# Patient Record
Sex: Male | Born: 1955
Health system: Southern US, Community
[De-identification: ages and names within clinical notes are randomized; demographics above are authoritative.]

## PROBLEM LIST (undated history)

## (undated) DIAGNOSIS — I4891 Unspecified atrial fibrillation: Secondary | ICD-10-CM

## (undated) DIAGNOSIS — E7800 Pure hypercholesterolemia, unspecified: Secondary | ICD-10-CM

## (undated) DIAGNOSIS — R0789 Other chest pain: Secondary | ICD-10-CM

## (undated) DIAGNOSIS — K219 Gastro-esophageal reflux disease without esophagitis: Secondary | ICD-10-CM

## (undated) DIAGNOSIS — Z87442 Personal history of urinary calculi: Secondary | ICD-10-CM

## (undated) DIAGNOSIS — J45909 Unspecified asthma, uncomplicated: Secondary | ICD-10-CM

## (undated) DIAGNOSIS — Q8500 Neurofibromatosis, unspecified: Secondary | ICD-10-CM

## (undated) DIAGNOSIS — I10 Essential (primary) hypertension: Secondary | ICD-10-CM

## (undated) DIAGNOSIS — N200 Calculus of kidney: Secondary | ICD-10-CM

## (undated) DIAGNOSIS — J309 Allergic rhinitis, unspecified: Secondary | ICD-10-CM

## (undated) DIAGNOSIS — E7849 Other hyperlipidemia: Secondary | ICD-10-CM

## (undated) HISTORY — PX: PROSTATE SURGERY: SHX751

## (undated) HISTORY — PX: ABDOMINAL SURGERY: SHX537

---

## 2004-08-06 ENCOUNTER — Ambulatory Visit: Payer: Self-pay | Admitting: Family Medicine

## 2004-08-09 ENCOUNTER — Ambulatory Visit: Payer: Self-pay | Admitting: Family Medicine

## 2004-08-28 ENCOUNTER — Ambulatory Visit: Payer: Self-pay | Admitting: Internal Medicine

## 2004-08-30 ENCOUNTER — Ambulatory Visit: Payer: Self-pay | Admitting: Internal Medicine

## 2004-09-14 ENCOUNTER — Ambulatory Visit: Payer: Self-pay | Admitting: Internal Medicine

## 2004-09-25 ENCOUNTER — Inpatient Hospital Stay: Payer: Self-pay | Admitting: Surgery

## 2004-12-12 ENCOUNTER — Ambulatory Visit: Payer: Self-pay | Admitting: Urology

## 2010-12-10 ENCOUNTER — Observation Stay: Payer: Self-pay | Admitting: Internal Medicine

## 2011-01-01 ENCOUNTER — Observation Stay: Payer: Self-pay | Admitting: Specialist

## 2011-07-01 LAB — CBC
HCT: 45.2 % (ref 40.0–52.0)
HGB: 15.1 g/dL (ref 13.0–18.0)
MCV: 90 fL (ref 80–100)
Platelet: 293 10*3/uL (ref 150–440)

## 2011-07-01 LAB — BASIC METABOLIC PANEL
Anion Gap: 10 (ref 7–16)
BUN: 25 mg/dL — ABNORMAL HIGH (ref 7–18)
Calcium, Total: 9.4 mg/dL (ref 8.5–10.1)
Chloride: 106 mmol/L (ref 98–107)
Co2: 26 mmol/L (ref 21–32)
Creatinine: 1.3 mg/dL (ref 0.60–1.30)
EGFR (Non-African Amer.): >60
Osmolality: 287 (ref 275–301)
Sodium: 142 mmol/L (ref 136–145)

## 2011-07-02 ENCOUNTER — Observation Stay: Payer: Self-pay | Admitting: Internal Medicine

## 2011-07-02 LAB — TROPONIN I: Troponin-I: <0.02 ng/mL

## 2011-07-02 LAB — CK TOTAL AND CKMB (NOT AT ARMC)
CK, Total: 44 U/L (ref 35–232)
CK, Total: 44 U/L (ref 35–232)
CK-MB: <0.5 ng/mL — ABNORMAL LOW (ref 0.5–3.6)
CK-MB: <0.5 ng/mL — ABNORMAL LOW (ref 0.5–3.6)

## 2011-09-09 ENCOUNTER — Observation Stay: Payer: Self-pay | Admitting: Internal Medicine

## 2011-09-09 LAB — BASIC METABOLIC PANEL
Anion Gap: 8 (ref 7–16)
BUN: 16 mg/dL (ref 7–18)
Calcium, Total: 9.2 mg/dL (ref 8.5–10.1)
Chloride: 108 mmol/L — ABNORMAL HIGH (ref 98–107)
Co2: 24 mmol/L (ref 21–32)
Creatinine: 1.11 mg/dL (ref 0.60–1.30)
EGFR (African American): >60
Glucose: 89 mg/dL (ref 65–99)
Osmolality: 280 (ref 275–301)
Potassium: 4 mmol/L (ref 3.5–5.1)
Sodium: 140 mmol/L (ref 136–145)

## 2011-09-09 LAB — TROPONIN I
Troponin-I: <0.02 ng/mL
Troponin-I: <0.02 ng/mL

## 2011-09-09 LAB — HEPATIC FUNCTION PANEL A (ARMC)
Alkaline Phosphatase: 67 U/L (ref 50–136)
Bilirubin,Total: 0.5 mg/dL (ref 0.2–1.0)
SGPT (ALT): 13 U/L
Total Protein: 6.6 g/dL (ref 6.4–8.2)

## 2011-09-09 LAB — CBC
HCT: 43.6 % (ref 40.0–52.0)
HGB: 14.3 g/dL (ref 13.0–18.0)
MCHC: 32.9 g/dL (ref 32.0–36.0)
MCV: 90 fL (ref 80–100)
Platelet: 316 10*3/uL (ref 150–440)
RDW: 14.5 % (ref 11.5–14.5)

## 2011-09-09 LAB — CK TOTAL AND CKMB (NOT AT ARMC)
CK, Total: 36 U/L (ref 35–232)
CK-MB: <0.5 ng/mL — ABNORMAL LOW (ref 0.5–3.6)

## 2011-09-09 LAB — CK-MB: CK-MB: <0.5 ng/mL — ABNORMAL LOW (ref 0.5–3.6)

## 2012-11-30 ENCOUNTER — Emergency Department: Payer: Self-pay | Admitting: Emergency Medicine

## 2014-06-26 DIAGNOSIS — E784 Other hyperlipidemia: Secondary | ICD-10-CM | POA: Diagnosis not present

## 2014-07-03 DIAGNOSIS — F419 Anxiety disorder, unspecified: Secondary | ICD-10-CM | POA: Diagnosis not present

## 2014-07-03 DIAGNOSIS — E784 Other hyperlipidemia: Secondary | ICD-10-CM | POA: Diagnosis not present

## 2014-07-09 NOTE — H&P (Signed)
PATIENT NAME:  Hector Pacheco, JELINSKI MR#:  [id number] DATE OF BIRTH:  Apr 17, 1955  DATE OF ADMISSION:  07/02/2011  REFERRING PHYSICIAN: Dr. Marjean Donna PRIMARY CARE PHYSICIAN: Dr. Jacqualine Code  CHIEF COMPLAINT: Chest pain.  HISTORY OF PRESENT ILLNESS: This is a 59 year old male with history of benign prostatic hypertrophy and mild mental retardation presents with complains of chest pain. Patient reports chest pain started today while patient was at rest. Patient describes the pain as pressure like, midsternal, nonradiating, waxed and waned, worsened by exertion. Patient reports he had similar episodes of pain last year when he was admitted to the hospital with negative work-up. Reports at this point pain is gone. Patient denies any shortness of breath, nausea, palpitations, sweating or vomiting with this episode. Patient was given 324 of aspirin in ED, did not require any nitroglycerin, aspirin; was resolved prior to that. Patient was here in October of last year due to some complaints of chest pain. Patient had negative stress test done last year.   PAST MEDICAL HISTORY: 1. Benign prostatic hypertrophy.  2. Mild mental retardation.   MEDICATIONS:  1. Aspirin 81 mg oral daily.  2. Nitroglycerin 0.4 mg sublingual every five minutes as needed.  3. Prilosec 20 mg oral daily.  4. TUMS 500 mg oral 2 times a day.   ALLERGIES: Flomax.   SOCIAL HISTORY: No smoking. No drinking. No drug use.   FAMILY HISTORY: Significant for father has history of  cancer. Mother has heart disease.  REVIEW OF SYSTEMS: CONSTITUTIONAL: Patient denies any fever, chills, weakness. EYES: Denies any blurry vision, double vision or pain or redness. ENT: Denies any tinnitus, ear pain, hearing loss. RESPIRATORY: Denies any cough, wheezing, hemoptysis, dyspnea. CARDIOVASCULAR: Has complaints of chest pain. Denies any orthopnea, edema, palpitations. GASTROINTESTINAL: Denies any nausea, vomiting, diarrhea, abdominal pain. GENITOURINARY:  Denies any dysuria, renal colic, hematuria. ENDOCRINE: Denies any polyuria, polydipsia, heat or cold intolerance. HEMATOLOGY: Denies any easy bruising, blood clots, bleeding diathesis. MUSCULOSKELETAL: Denies any back pain, arthritis, cramps. NEUROLOGICAL: Denies any numbness, weakness, dysarthria, epilepsy, tremors. PSYCH: Denies any alcohol or drug abuse. No bipolar disorder or insomnia or anxiety.   PHYSICAL EXAMINATION:  VITAL SIGNS: Temperature 98.4, pulse 70, respiratory rate 16, blood pressure 130/79, pulse oximetry 96% on room air.   GENERAL: Well-nourished male, is comfortable in bed, no apparent distress.   HEENT: Head is normocephalic. Pupils equal, reactive to light. Pink conjunctiva. Anicteric sclera. Moist oral mucosa.   NECK: Supple. No thyromegaly. No JVD.   CHEST: Good air entry bilaterally. No wheezing, rales, rhonchi.   CARDIOVASCULAR: S1, S2 heard. No rubs, murmur, gallops.   ABDOMEN: Soft, nontender, nondistended. Bowel sounds present.   EXTREMITIES: No edema, no clubbing, no cyanosis.   NEUROLOGICAL: Cranial nerves grossly intact. Moves all extremities 5/5 without any focal deficit.   PSYCHIATRIC: Appropriate affect. Awake, alert x3. Intact judgment and insight.   LABORATORY, DIAGNOSTIC AND RADIOLOGICAL DATA: Glucose 90, BUN 25, creatinine 1.3, sodium 142, potassium 4.2, chloride 106, CO2 26, calcium 9.4, troponin less than 0.02. White blood cells 7.8, hemoglobin 15.1, hematocrit 45.2, platelets 293.   EKG shows no changes from previous.   ASSESSMENT AND PLAN:  1. Chest pain. Patient has recurrent episodes of chest pain. Had negative stress test done last year. No EKG changes. Pain resolved at this point. Will continue patient on aspirin 81 mg oral daily. Will cycle cardiac enzymes. Secondary to patient's multiple admissions because of this chest pain will consult his primary cardiologist, Dr. Saralyn Pilar.  2.  Will start patient on Protonix for GI prophylaxis and  subcutaneous heparin for deep vein thrombosis prophylaxis.   TOTAL TIME SPENT FOR PATIENT CARE: 45 minutes.   CODE STATUS: Patient is FULL CODE.   ____________________________ Albertine Patricia, MD dse:cms D: 07/02/2011 04:34:32 ET T: 07/02/2011 08:34:51 ET JOB#: [id number]  cc: Albertine Patricia, MD, <Dictator> Milinda Pointer. Jacqualine Code, MD Lenzie Sandler Graciela Husbands MD ELECTRONICALLY SIGNED 07/03/2011 22:03

## 2014-07-09 NOTE — Discharge Summary (Signed)
PATIENT NAME:  Hector Pacheco, Hector Pacheco MR#:  [id number] DATE OF BIRTH:  1955-04-12  DATE OF ADMISSION:  07/02/2011 DATE OF DISCHARGE:  07/02/2011  ADMITTING PHYSICIAN: Phillips Climes, MD.  DISCHARGING PHYSICIAN: Gladstone Lighter, MD.   PRIMARY CARE PHYSICIAN: Debbora Dus, MD.  PRIMARY CARDIOLOGIST: Isaias Cowman, MD.   [place]: None.   DISCHARGE DIAGNOSES:  1. Chest pain, likely gastritis.  2. Gastroesophageal reflux disease.   DISCHARGE HOME MEDICATIONS:  1. Aspirin 81 mg p.o. daily.  2. Nitroglycerin sublingual tablet 0.4 mg p.o. daily p.r.n. for chest pain.  3. Tums 500 mg 2 tablets daily.  4. Klonopin 0.5 mg p.o. b.i.d. p.r.n.  5. Nexium 40 mg p.o. b.i.d. for three weeks and then daily.   DISCHARGE DIET: Low sodium diet.   DISCHARGE ACTIVITY: As tolerated.   FOLLOWUP INSTRUCTIONS:  1. Follow-up with Dr. Saralyn Pilar in two weeks.  2. Gastroenterology follow-up in 2 to 3 weeks.  3. Primary care physician follow-up in three weeks.   LABS AND IMAGING STUDIES PRIOR TO DISCHARGE: WBC 7.8, hemoglobin 15.1, hematocrit 45.2, platelet count 293. Sodium 142, potassium 4.2, chloride 106, bicarbonate 26, BUN 25, creatinine 1.3, glucose 90, calcium 9.4. Troponin x3 sets less than 0.02. Echo Doppler showing normal LV systolic function, ejection fraction 50%. No wall motion abnormalities. Mild mitral and tricuspid regurgitation.  BRIEF HOSPITAL COURSE: Hector Pacheco is a young 59 year old male with no significant past medical history other than mild mental retardation who was brought in secondary to chest pain that started after eating meal last evening. The patient states that pain is in the epigastric region radiating up his mid chest. He was diagnosed with gastroesophageal reflux disease when seen by Dr. Saralyn Pilar in the office and was put on Prilosec. He has not had further chest pain by the time of my evaluation. He had a negative stress test done last year. Describes  on and off heartburn symptoms. His pain was most likely secondary to underlying possible gastritis. He was given one dose of IV Protonix and his Prilosec was changed to Nexium and advised to take twice a day for at least 2 to 3 weeks and then change to once a day. He is also set up with [facility] gastroenterologist. He will be seeing Hector Cargo, NP on 07/24/2011 at 2:00 p.m. for his gastritis symptoms. He was advised to follow up with Dr. Saralyn Pilar and appointment made for 07/21/2011 at 11:45 a.m. In the meanwhile, he can continue to take p.r.n. nitroglycerin sublingual tablets as needed. He has been ruled out as his enzymes x3 are negative and his echocardiogram did not show any wall motion abnormalities and no EKG changes, so he is being discharged home in stable condition.   DISCHARGE DISPOSITION: Home.   DISCHARGE CONDITION: Stable.   TIME SPENT ON DISCHARGE: 40 minutes.   ____________________________ Gladstone Lighter, MD rk:ap D: 07/02/2011 15:09:58 ET T: 07/03/2011 12:26:26 ET JOB#: [id number]  cc: Gladstone Lighter, MD, <Dictator> Milinda Pointer. Jacqualine Code, MD Isaias Cowman, MD Payton Emerald, NP Gladstone Lighter MD ELECTRONICALLY SIGNED 07/04/2011 14:37

## 2014-07-09 NOTE — Consult Note (Signed)
PATIENT NAME:  Hector Pacheco, Hector Pacheco MR#:  [id number] DATE OF BIRTH:  1955/04/03  DATE OF CONSULTATION:  09/10/2011  REFERRING PHYSICIAN:  Dr. Gilford Rile CONSULTING PHYSICIAN:  Corey Skains, MD PRIMARY CARE PHYSICIAN: Dr. Gilford Rile   REASON FOR CONSULTATION: Chest pain with hypertension, hyperlipidemia.   CHIEF COMPLAINT: "I've had shooting pains."   HISTORY OF PRESENT ILLNESS: The patient is a 59 year old male with a history of episodes of chest pain, atypical in nature, now worsening and increasing in frequency over the last several weeks, with concerns of mild shortness of breath. He was admitted in the hospital with an EKG showing normal sinus rhythm, left axis deviation, cannot rule out inferior infarct, age undetermined. Troponin and CK-MB initially have been in normal. The patient does have some borderline hypertension, hyperlipidemia, but has not had any increasing treatment at this time with medications. The patient has had no further chest pain during this period of time.   REVIEW OF SYSTEMS: The remainder of his review of systems is negative for vision change, ringing in the ears, hearing loss, cough, congestion, heartburn, nausea, vomiting, diarrhea, bloody stools, stomach pain, extremity pain, leg weakness, cramping of the buttocks, known blood clots, headaches, blackouts, dizzy spells, nosebleeds, congestion, trouble swallowing, frequent urination, urination at night, muscle weakness, numbness, anxiety, depression, skin lesions, or skin rashes.   PAST MEDICAL HISTORY:  1. Hypertension.  2. Hyperlipidemia.   FAMILY HISTORY: His father had coronary artery bypass graft and myocardial infarction.   SOCIAL HISTORY: He currently denies alcohol or tobacco use.   ALLERGIES: He has no known drug allergies.   CURRENT MEDICATIONS: As listed.   PHYSICAL EXAMINATION:  VITAL SIGNS: Blood pressure is 126/68 bilaterally, heart rate 72 upright, reclining, and regular.   GENERAL: He is a  well-appearing male in no acute distress.   HEENT: No icterus, thyromegaly, ulcers, hemorrhage, or xanthelasma.   HEART: Regular rate and rhythm. Normal S1 and S2 without murmur, gallop, or rub. Point of maximal impulse is normal size and placement. Carotid upstroke is normal without bruit. Jugular venous pressure is normal.   LUNGS: Lungs have a few basilar crackles with normal respirations.   ABDOMEN: Soft, nontender, without hepatosplenomegaly or masses. Abdominal aorta is normal size without bruit.   EXTREMITIES: 2+ bilateral pulses in dorsal, pedal, radial, and femoral arteries without lower extremity edema, cyanosis, clubbing, or ulcers.   NEUROLOGICAL: He is oriented to time, place, and person with normal mood and affect.   ASSESSMENT: A 60 year old male with borderline hypertension, hyperlipidemia, abnormal EKG with atypical type chest pain but concerning for unstable angina, needing further treatment options.   RECOMMENDATIONS:  1. Treadmill EKG to assess exercise tolerance, myocardial infarction, myocardial ischemia and induction of chest.  2. Echocardiogram for LV systolic dysfunction, current symptoms as well as abnormal EKG for further risk stratification. 3. Continue surveillance of hyperlipidemia and treatment thereof as necessary with a goal LDL below 100 mg/dL considering his statin.  4. No additional medication at this time for hypertension, which is borderline,  and the patient wishes not to use medications at this time.  5. Further diagnostic testing and treatment options after above.  ____________________________ Corey Skains, MD bjk:cbb D: 09/10/2011 08:49:03 ET T: 09/10/2011 11:36:19 ET JOB#: [id number] Corey Skains MD ELECTRONICALLY SIGNED 09/11/2011 8:31

## 2014-07-09 NOTE — H&P (Signed)
PATIENT NAME:  Hector Pacheco, Hector Pacheco MR#:  [id number] DATE OF BIRTH:  1956-02-23  DATE OF ADMISSION:  09/09/2011  PRIMARY CARE PHYSICIAN: Was Dr. Jacqualine Code. He will be seeing Dr. Gilford Rile in the future. ED REFERRING PHYSICIAN: Dr. Lovena Le  CHIEF COMPLAINT: Chest pressure.   HISTORY OF PRESENT ILLNESS: Patient is a 59 year old Caucasian male with previous history of having chest pain in the past who has had multiple admissions for the same complaint who had a stress test done in September 2012 which was negative for ischemia or infarct returns back with the same complaint of having substernal sharp chest pain. He reports that he went to walk to his mailbox and started to have sharp pain. He also got short of breath with this. This is similar to his previous episodes. He states that he was not sure whether this was his heart or had reflux. Prior to this episode he is not sure when his last chest pain symptoms were. He does have some mental retardation and states that he is not able to remember a lot of things. He otherwise denies any fevers, chills. No coughing. No abdominal pain. No nausea, vomiting, diarrhea. No urinary symptoms.   PAST MEDICAL HISTORY:  1. History of benign prostatic hypertrophy.  2. Mild mental retardation.  3. Gastroesophageal reflux disease.  4. Recurrent chest pain.   CURRENT MEDICATIONS AT HOME:  1. Aspirin 81 mg 1 tab p.o. daily.  2. Gas-X one cap daily as needed. 3. Nitroglycerin 0.4 sublingually as needed for chest pain. 4. Prilosec over-the-counter 20 daily.  5. TUMS 500, 2 tabs daily.   ALLERGIES: Penicillin.   SOCIAL HISTORY: No smoking. No drinking. No drug use.   FAMILY HISTORY: Significant for father has history of cancer, mother had heart disease.   REVIEW OF SYSTEMS: CONSTITUTIONAL: Denies any fevers, chills. No weakness. No weight gain or weight loss. EYES: No blurred vision. No double vision. No pain. No redness. No inflammation. ENT: Denies any tinnitus, ear  pain. No hearing loss. RESPIRATORY: Denies any cough, wheezing, hemoptysis. CARDIOVASCULAR: Complains of chest pain which is recurrent. Denies any orthopnea, edema, or palpitations. GASTROINTESTINAL: Denies any nausea, vomiting or diarrhea or abdominal pain. Does have history of gastroesophageal reflux disease. GENITOURINARY: Denies any dysuria, renal calculus, or hematuria. ENDOCRINE: Denies any polyuria, polydipsia, heat or cold intolerance. HEMATOLOGIC: Denies any bruising or bleeding or anemia. MUSCULOSKELETAL: Denies any arthritis or gout symptoms. NEUROLOGICAL: Denies any numbness, weakness. No cerebrovascular accident. No transient ischemic attack. PSYCHIATRIC: Denies any depression, anxiety or bipolar disorder.   PHYSICAL EXAMINATION:  VITAL SIGNS: Temperature 98.6, pulse 98, respirations 20, blood pressure 134/83, O2 97%.   GENERAL: Patient appears comfortable in no acute distress.   HEENT: Pupils equal, round, reactive to light and accommodation. Extraocular movements intact. There is no conjunctival pallor. No scleral icterus. Nasal exam shows no drainage or ulceration. External ear exam shows no drainage.   NECK: Supple. No thyromegaly. No JVD.   CARDIOVASCULAR: Regular rate and rhythm. No murmurs, rubs, clicks, or gallops. PMI is not displaced.   LUNGS: Clear to auscultation bilaterally without any rales, rhonchi, wheezing.   ABDOMEN: Soft, nontender, nondistended. Positive bowel sounds x4.   EXTREMITIES: No clubbing, cyanosis, edema.   SKIN: No rash.   LYMPHATICS: No lymph nodes palpable.   VASCULAR: Good DP, PT pulses.   NEUROLOGICAL: Cranial nerves grossly intact. Moves all extremities without any focal deficits.   PSYCH: Awake, alert x3. Not anxious or depressed.   LABORATORY, DIAGNOSTIC AND RADIOLOGICAL  DATA: Glucose 89, BUN 16, creatinine 1.11, sodium 140, potassium 4.0, chloride 108, CO2 24, anion gap 8, calcium 9.2. LFTs: Total protein 6.6, albumin 3.3, bilirubin  0.5, alkaline phosphatase 87, AST 11, ALT 13. CPK 36. CK-MB less than 0.5. Troponin less than 0.02. WBC 9.5, hemoglobin 14.3, platelet count 316. D-dimer was less than 0.022. EKG showed normal sinus rhythm without any ST-T wave changes.   ASSESSMENT AND PLAN: Patient is a 59 year old with recurrent chest pains, negative stress test last year presents back with same symptoms, has been admitted with same symptoms multiple times.  1. Chest pain, atypical. Will place her on observation. I am going to consult his cardiologist again to consider possible cardiac catheterization. That way patient is reassured and he does not get readmitted again and again. Will check serial cardiac enzymes as well. Increase his Prilosec dose to 40 mg. Continue rest of his reflux medications.  2. Gastroesophageal reflux disease. Increased dose of Prilosec.  3. History of benign prostatic hypertrophy. Currently not on any treatment.  4. Miscellaneous: He is ambulatory. He will be on aspirin for deep vein thrombosis prophylaxis.   TIME SPENT: 35 minutes.    ____________________________ Lafonda Mosses Posey Pronto, MD shp:cms D: 09/09/2011 19:17:12 ET T: 09/10/2011 06:58:51 ET JOB#: [id number]  cc: Pama Roskos H. Posey Pronto, MD, <Dictator> Alric Seton MD ELECTRONICALLY SIGNED 09/11/2011 16:37

## 2014-07-09 NOTE — Discharge Summary (Signed)
PATIENT NAME:  Hector Pacheco, Hector Pacheco MR#:  [id number] DATE OF BIRTH:  Jul 24, 1955  DATE OF ADMISSION:  09/09/2011 DATE OF DISCHARGE:  09/10/2011  ADMITTING PHYSICIAN: Dustin Flock, MD  DISCHARGING PHYSICIAN: Gladstone Lighter, MD  PRIMARY CARE PHYSICIAN: Debbora Dus, MD  CONSULTANTS:  Serafina Royals, MD - Cardiology.  DISCHARGE DIAGNOSES:  1. Chest pain, likely anxiety related versus musculoskeletal chest pain.  2. Gastroesophageal reflux disease.  3. Anxiety.  4. Hyperlipidemia.  DISCHARGE HOME MEDICATIONS: 1. Aspirin 81 mg p.o. daily.  2. Simvastatin 20 mg p.o. daily. 3. Sublingual nitroglycerine 0.4 mg sublingual every five minutes as needed for chest pain.  4. Tums 500 mg 2 tablets daily.  5. Prilosec 20 mg p.o. daily.  6. Simethicone 125 mg p.o. daily as needed for bloating and gas.   DISCHARGE DIET: Low-sodium diet.   DISCHARGE ACTIVITY: As tolerated.   FOLLOWUP INSTRUCTIONS:  1. Cardiology follow-up in 1 to 2 weeks for outpatient stress test.  2. Appointment is scheduled with Dr. Saralyn Pilar for 10/01/2011 at 9:45 a.m. 3. PCP appointment scheduled for 09/22/2011 at 10:30 a.m.  LABS AND IMAGING STUDIES: LDL cholesterol 150, HDL 46, total cholesterol 210, and triglycerides 68. Troponins x3 sets remained negative in the hospital.  Chest x-ray on admission showed clear lung fields. No acute cardiopulmonary disease.   WBC 9.5, hemoglobin 14.3, hematocrit 43.6, and platelet count 316.  Sodium 140, potassium 4.0, chloride 108, bicarbonate 24, BUN 16, creatinine 1.11, glucose 89, and calcium 9.2.   ALT 13, AST 11, alkaline phosphatase 67, total bilirubin 0.5, and albumin 3.3.   D-dimer is less than 0.22.   BRIEF HOSPITAL COURSE:  1. Mr. Atchley is a 59 year old elderly male with past medical history significant only for BPH, mild mental retardation with mildly low IQ, gastroesophageal reflux disease, and prior admissions with recurrent chest pain. He presented to the  hospital after he had chest pain. The patient says he visited his friend whose mother died and that made him emotional because the patient takes care of his ill mother. Since then he said he has like a sharp pain in his chest which was throbbing and could pinpoint to the spot. So he was admitted under observation. He just had a recent stress test in September 2012 which was negative, and since this was very atypical chest pain, he was mostly admitted to rule out acute myocardial infarction by ordering cardiac enzymes. The patient has been chest pain free and in a very good mood at the time of discharge. Three sets of troponins came back negative and since this was atypical chest pain the patient agreed to followup with his cardiologist and also with his PCP and if needed will get an outpatient stress test. All his other home medications were continued.  2. Hyperlipidemia. LDL was found to be more than 150; however, he has risk factors, especially family history of heart disease. He was started on simvastatin at the time of discharge. His overall course has been otherwise uneventful.   DISCHARGE CONDITION: Stable.        DISCHARGE DISPOSITION: Home.   TIME SPENT ON DISCHARGE: 45 minutes. ____________________________ Gladstone Lighter, MD rk:slb D: 09/10/2011 14:26:14 ET T: 09/11/2011 11:54:34 ET JOB#: [id number]  cc: Gladstone Lighter, MD, <Dictator> Milinda Pointer. Jacqualine Code, MD Isaias Cowman, MD Gladstone Lighter MD ELECTRONICALLY SIGNED 09/16/2011 11:09

## 2014-09-02 DIAGNOSIS — R102 Pelvic and perineal pain: Secondary | ICD-10-CM | POA: Diagnosis not present

## 2014-11-20 DIAGNOSIS — R312 Other microscopic hematuria: Secondary | ICD-10-CM | POA: Diagnosis not present

## 2014-11-20 DIAGNOSIS — R300 Dysuria: Secondary | ICD-10-CM | POA: Diagnosis not present

## 2014-11-23 DIAGNOSIS — N401 Enlarged prostate with lower urinary tract symptoms: Secondary | ICD-10-CM | POA: Diagnosis not present

## 2014-11-23 DIAGNOSIS — R3915 Urgency of urination: Secondary | ICD-10-CM | POA: Diagnosis not present

## 2014-11-23 DIAGNOSIS — R350 Frequency of micturition: Secondary | ICD-10-CM | POA: Diagnosis not present

## 2014-11-23 DIAGNOSIS — R351 Nocturia: Secondary | ICD-10-CM | POA: Diagnosis not present

## 2014-12-05 DIAGNOSIS — N401 Enlarged prostate with lower urinary tract symptoms: Secondary | ICD-10-CM | POA: Diagnosis not present

## 2014-12-12 DIAGNOSIS — R351 Nocturia: Secondary | ICD-10-CM | POA: Diagnosis not present

## 2014-12-12 DIAGNOSIS — R3915 Urgency of urination: Secondary | ICD-10-CM | POA: Diagnosis not present

## 2014-12-12 DIAGNOSIS — R350 Frequency of micturition: Secondary | ICD-10-CM | POA: Diagnosis not present

## 2014-12-12 DIAGNOSIS — N401 Enlarged prostate with lower urinary tract symptoms: Secondary | ICD-10-CM | POA: Diagnosis not present

## 2014-12-19 DIAGNOSIS — R351 Nocturia: Secondary | ICD-10-CM | POA: Diagnosis not present

## 2014-12-19 DIAGNOSIS — R3915 Urgency of urination: Secondary | ICD-10-CM | POA: Diagnosis not present

## 2014-12-19 DIAGNOSIS — N401 Enlarged prostate with lower urinary tract symptoms: Secondary | ICD-10-CM | POA: Diagnosis not present

## 2014-12-19 DIAGNOSIS — R350 Frequency of micturition: Secondary | ICD-10-CM | POA: Diagnosis not present

## 2014-12-25 DIAGNOSIS — E784 Other hyperlipidemia: Secondary | ICD-10-CM | POA: Diagnosis not present

## 2014-12-25 DIAGNOSIS — F419 Anxiety disorder, unspecified: Secondary | ICD-10-CM | POA: Diagnosis not present

## 2015-01-01 DIAGNOSIS — J4520 Mild intermittent asthma, uncomplicated: Secondary | ICD-10-CM | POA: Diagnosis not present

## 2015-01-01 DIAGNOSIS — F419 Anxiety disorder, unspecified: Secondary | ICD-10-CM | POA: Diagnosis not present

## 2015-01-01 DIAGNOSIS — Q8500 Neurofibromatosis, unspecified: Secondary | ICD-10-CM | POA: Diagnosis not present

## 2015-01-01 DIAGNOSIS — Z0001 Encounter for general adult medical examination with abnormal findings: Secondary | ICD-10-CM | POA: Diagnosis not present

## 2015-01-01 DIAGNOSIS — Z23 Encounter for immunization: Secondary | ICD-10-CM | POA: Diagnosis not present

## 2015-01-02 DIAGNOSIS — N401 Enlarged prostate with lower urinary tract symptoms: Secondary | ICD-10-CM | POA: Diagnosis not present

## 2015-01-10 DIAGNOSIS — R3915 Urgency of urination: Secondary | ICD-10-CM | POA: Diagnosis not present

## 2015-01-10 DIAGNOSIS — R351 Nocturia: Secondary | ICD-10-CM | POA: Diagnosis not present

## 2015-01-10 DIAGNOSIS — R350 Frequency of micturition: Secondary | ICD-10-CM | POA: Diagnosis not present

## 2015-01-10 DIAGNOSIS — N401 Enlarged prostate with lower urinary tract symptoms: Secondary | ICD-10-CM | POA: Diagnosis not present

## 2015-02-14 DIAGNOSIS — N138 Other obstructive and reflux uropathy: Secondary | ICD-10-CM | POA: Diagnosis not present

## 2015-02-14 DIAGNOSIS — N401 Enlarged prostate with lower urinary tract symptoms: Secondary | ICD-10-CM | POA: Diagnosis not present

## 2015-06-18 DIAGNOSIS — B353 Tinea pedis: Secondary | ICD-10-CM | POA: Diagnosis not present

## 2015-09-19 DIAGNOSIS — M25511 Pain in right shoulder: Secondary | ICD-10-CM | POA: Diagnosis not present

## 2015-10-19 DIAGNOSIS — M25532 Pain in left wrist: Secondary | ICD-10-CM | POA: Diagnosis not present

## 2015-11-20 DIAGNOSIS — R238 Other skin changes: Secondary | ICD-10-CM | POA: Diagnosis not present

## 2015-11-20 DIAGNOSIS — I781 Nevus, non-neoplastic: Secondary | ICD-10-CM | POA: Diagnosis not present

## 2015-11-20 DIAGNOSIS — L989 Disorder of the skin and subcutaneous tissue, unspecified: Secondary | ICD-10-CM | POA: Diagnosis not present

## 2015-11-28 DIAGNOSIS — I781 Nevus, non-neoplastic: Secondary | ICD-10-CM | POA: Diagnosis not present

## 2015-12-11 DIAGNOSIS — D1801 Hemangioma of skin and subcutaneous tissue: Secondary | ICD-10-CM | POA: Diagnosis not present

## 2015-12-11 DIAGNOSIS — Q8501 Neurofibromatosis, type 1: Secondary | ICD-10-CM | POA: Diagnosis not present

## 2015-12-11 DIAGNOSIS — D485 Neoplasm of uncertain behavior of skin: Secondary | ICD-10-CM | POA: Diagnosis not present

## 2015-12-11 DIAGNOSIS — L980 Pyogenic granuloma: Secondary | ICD-10-CM | POA: Diagnosis not present

## 2015-12-21 DIAGNOSIS — Z23 Encounter for immunization: Secondary | ICD-10-CM | POA: Diagnosis not present

## 2015-12-21 DIAGNOSIS — Z125 Encounter for screening for malignant neoplasm of prostate: Secondary | ICD-10-CM | POA: Diagnosis not present

## 2015-12-21 DIAGNOSIS — E784 Other hyperlipidemia: Secondary | ICD-10-CM | POA: Diagnosis not present

## 2015-12-21 DIAGNOSIS — Q8501 Neurofibromatosis, type 1: Secondary | ICD-10-CM | POA: Diagnosis not present

## 2015-12-21 DIAGNOSIS — Z0001 Encounter for general adult medical examination with abnormal findings: Secondary | ICD-10-CM | POA: Diagnosis not present

## 2016-03-25 DIAGNOSIS — K219 Gastro-esophageal reflux disease without esophagitis: Secondary | ICD-10-CM | POA: Diagnosis not present

## 2016-06-18 DIAGNOSIS — F419 Anxiety disorder, unspecified: Secondary | ICD-10-CM | POA: Diagnosis not present

## 2016-06-18 DIAGNOSIS — Z1159 Encounter for screening for other viral diseases: Secondary | ICD-10-CM | POA: Diagnosis not present

## 2016-06-18 DIAGNOSIS — Z0001 Encounter for general adult medical examination with abnormal findings: Secondary | ICD-10-CM | POA: Diagnosis not present

## 2016-06-18 DIAGNOSIS — E784 Other hyperlipidemia: Secondary | ICD-10-CM | POA: Diagnosis not present

## 2016-06-18 DIAGNOSIS — K219 Gastro-esophageal reflux disease without esophagitis: Secondary | ICD-10-CM | POA: Diagnosis not present

## 2016-06-18 DIAGNOSIS — Q8501 Neurofibromatosis, type 1: Secondary | ICD-10-CM | POA: Diagnosis not present

## 2016-06-30 DIAGNOSIS — M654 Radial styloid tenosynovitis [de Quervain]: Secondary | ICD-10-CM | POA: Diagnosis not present

## 2016-08-04 DIAGNOSIS — H2513 Age-related nuclear cataract, bilateral: Secondary | ICD-10-CM | POA: Diagnosis not present

## 2016-08-15 DIAGNOSIS — Z1211 Encounter for screening for malignant neoplasm of colon: Secondary | ICD-10-CM | POA: Diagnosis not present

## 2016-08-23 DIAGNOSIS — Z23 Encounter for immunization: Secondary | ICD-10-CM | POA: Diagnosis not present

## 2016-08-23 DIAGNOSIS — R2232 Localized swelling, mass and lump, left upper limb: Secondary | ICD-10-CM | POA: Diagnosis not present

## 2016-08-23 DIAGNOSIS — L089 Local infection of the skin and subcutaneous tissue, unspecified: Secondary | ICD-10-CM | POA: Diagnosis not present

## 2016-10-30 ENCOUNTER — Other Ambulatory Visit
Admission: RE | Admit: 2016-10-30 | Discharge: 2016-10-30 | Disposition: A | Discharge status: Home / Self Care | Payer: Medicare HMO | Source: Ambulatory Visit | Attending: Family Medicine | Admitting: Family Medicine

## 2016-10-30 DIAGNOSIS — R079 Chest pain, unspecified: Secondary | ICD-10-CM | POA: Insufficient documentation

## 2016-10-30 LAB — TROPONIN I: Troponin I: <0.03 ng/mL (ref <0.03)

## 2016-10-31 DIAGNOSIS — R079 Chest pain, unspecified: Secondary | ICD-10-CM | POA: Diagnosis not present

## 2016-10-31 DIAGNOSIS — E784 Other hyperlipidemia: Secondary | ICD-10-CM | POA: Diagnosis not present

## 2016-11-04 DIAGNOSIS — R079 Chest pain, unspecified: Secondary | ICD-10-CM | POA: Diagnosis not present

## 2016-11-14 DIAGNOSIS — R0789 Other chest pain: Secondary | ICD-10-CM | POA: Diagnosis not present

## 2016-11-14 DIAGNOSIS — Q8501 Neurofibromatosis, type 1: Secondary | ICD-10-CM | POA: Diagnosis not present

## 2016-12-01 ENCOUNTER — Ambulatory Visit: Admit: 2016-12-01 | Payer: Self-pay | Admitting: Unknown Physician Specialty

## 2016-12-01 SURGERY — COLONOSCOPY WITH PROPOFOL
Anesthesia: General

## 2017-01-06 DIAGNOSIS — K219 Gastro-esophageal reflux disease without esophagitis: Secondary | ICD-10-CM | POA: Diagnosis not present

## 2017-01-06 DIAGNOSIS — R079 Chest pain, unspecified: Secondary | ICD-10-CM | POA: Diagnosis not present

## 2017-01-06 DIAGNOSIS — Q8501 Neurofibromatosis, type 1: Secondary | ICD-10-CM | POA: Diagnosis not present

## 2017-01-06 DIAGNOSIS — Z5181 Encounter for therapeutic drug level monitoring: Secondary | ICD-10-CM | POA: Diagnosis not present

## 2017-01-06 DIAGNOSIS — E7849 Other hyperlipidemia: Secondary | ICD-10-CM | POA: Diagnosis not present

## 2017-01-06 DIAGNOSIS — Z23 Encounter for immunization: Secondary | ICD-10-CM | POA: Diagnosis not present

## 2017-05-14 DIAGNOSIS — R079 Chest pain, unspecified: Secondary | ICD-10-CM | POA: Diagnosis not present

## 2017-06-30 DIAGNOSIS — Z125 Encounter for screening for malignant neoplasm of prostate: Secondary | ICD-10-CM | POA: Diagnosis not present

## 2017-06-30 DIAGNOSIS — E7849 Other hyperlipidemia: Secondary | ICD-10-CM | POA: Diagnosis not present

## 2017-06-30 DIAGNOSIS — Z5181 Encounter for therapeutic drug level monitoring: Secondary | ICD-10-CM | POA: Diagnosis not present

## 2017-07-07 DIAGNOSIS — Z0001 Encounter for general adult medical examination with abnormal findings: Secondary | ICD-10-CM | POA: Diagnosis not present

## 2017-07-07 DIAGNOSIS — Z0000 Encounter for general adult medical examination without abnormal findings: Secondary | ICD-10-CM | POA: Diagnosis not present

## 2017-07-07 DIAGNOSIS — D649 Anemia, unspecified: Secondary | ICD-10-CM | POA: Diagnosis not present

## 2017-07-07 DIAGNOSIS — E7849 Other hyperlipidemia: Secondary | ICD-10-CM | POA: Diagnosis not present

## 2017-07-07 DIAGNOSIS — F419 Anxiety disorder, unspecified: Secondary | ICD-10-CM | POA: Diagnosis not present

## 2017-07-07 DIAGNOSIS — K219 Gastro-esophageal reflux disease without esophagitis: Secondary | ICD-10-CM | POA: Diagnosis not present

## 2017-07-10 DIAGNOSIS — Z1211 Encounter for screening for malignant neoplasm of colon: Secondary | ICD-10-CM | POA: Diagnosis not present

## 2017-07-10 DIAGNOSIS — R710 Precipitous drop in hematocrit: Secondary | ICD-10-CM | POA: Diagnosis not present

## 2017-08-05 DIAGNOSIS — H2513 Age-related nuclear cataract, bilateral: Secondary | ICD-10-CM | POA: Diagnosis not present

## 2017-10-19 DIAGNOSIS — R0602 Shortness of breath: Secondary | ICD-10-CM | POA: Diagnosis not present

## 2017-10-19 DIAGNOSIS — R079 Chest pain, unspecified: Secondary | ICD-10-CM | POA: Diagnosis not present

## 2017-10-19 DIAGNOSIS — E7849 Other hyperlipidemia: Secondary | ICD-10-CM | POA: Diagnosis not present

## 2017-12-30 DIAGNOSIS — D649 Anemia, unspecified: Secondary | ICD-10-CM | POA: Diagnosis not present

## 2017-12-30 DIAGNOSIS — K219 Gastro-esophageal reflux disease without esophagitis: Secondary | ICD-10-CM | POA: Diagnosis not present

## 2018-01-06 DIAGNOSIS — Z23 Encounter for immunization: Secondary | ICD-10-CM | POA: Diagnosis not present

## 2018-01-06 DIAGNOSIS — Q8501 Neurofibromatosis, type 1: Secondary | ICD-10-CM | POA: Diagnosis not present

## 2018-01-06 DIAGNOSIS — M25552 Pain in left hip: Secondary | ICD-10-CM | POA: Diagnosis not present

## 2018-01-06 DIAGNOSIS — D509 Iron deficiency anemia, unspecified: Secondary | ICD-10-CM | POA: Diagnosis not present

## 2018-01-06 DIAGNOSIS — D649 Anemia, unspecified: Secondary | ICD-10-CM | POA: Diagnosis not present

## 2018-01-06 DIAGNOSIS — K219 Gastro-esophageal reflux disease without esophagitis: Secondary | ICD-10-CM | POA: Diagnosis not present

## 2018-01-06 DIAGNOSIS — E7849 Other hyperlipidemia: Secondary | ICD-10-CM | POA: Diagnosis not present

## 2018-01-06 DIAGNOSIS — F419 Anxiety disorder, unspecified: Secondary | ICD-10-CM | POA: Diagnosis not present

## 2018-04-19 DIAGNOSIS — E7849 Other hyperlipidemia: Secondary | ICD-10-CM | POA: Diagnosis not present

## 2018-04-19 DIAGNOSIS — R0602 Shortness of breath: Secondary | ICD-10-CM | POA: Diagnosis not present

## 2018-07-02 DIAGNOSIS — D509 Iron deficiency anemia, unspecified: Secondary | ICD-10-CM | POA: Diagnosis not present

## 2018-07-02 DIAGNOSIS — E7849 Other hyperlipidemia: Secondary | ICD-10-CM | POA: Diagnosis not present

## 2018-07-14 DIAGNOSIS — Z0000 Encounter for general adult medical examination without abnormal findings: Secondary | ICD-10-CM | POA: Diagnosis not present

## 2018-07-14 DIAGNOSIS — F419 Anxiety disorder, unspecified: Secondary | ICD-10-CM | POA: Diagnosis not present

## 2018-07-14 DIAGNOSIS — Q8501 Neurofibromatosis, type 1: Secondary | ICD-10-CM | POA: Diagnosis not present

## 2018-07-14 DIAGNOSIS — K219 Gastro-esophageal reflux disease without esophagitis: Secondary | ICD-10-CM | POA: Diagnosis not present

## 2018-07-14 DIAGNOSIS — E7849 Other hyperlipidemia: Secondary | ICD-10-CM | POA: Diagnosis not present

## 2018-07-14 DIAGNOSIS — Z0001 Encounter for general adult medical examination with abnormal findings: Secondary | ICD-10-CM | POA: Diagnosis not present

## 2018-08-04 DIAGNOSIS — H2513 Age-related nuclear cataract, bilateral: Secondary | ICD-10-CM | POA: Diagnosis not present

## 2018-10-25 ENCOUNTER — Encounter: Payer: Self-pay | Admitting: Emergency Medicine

## 2018-10-25 ENCOUNTER — Emergency Department
Admission: EM | Admit: 2018-10-25 | Discharge: 2018-10-25 | Disposition: A | Discharge status: Home / Self Care | Payer: Medicare HMO | Attending: Emergency Medicine | Admitting: Emergency Medicine

## 2018-10-25 ENCOUNTER — Other Ambulatory Visit: Payer: Self-pay

## 2018-10-25 DIAGNOSIS — T675XXA Heat exhaustion, unspecified, initial encounter: Secondary | ICD-10-CM | POA: Insufficient documentation

## 2018-10-25 DIAGNOSIS — J45909 Unspecified asthma, uncomplicated: Secondary | ICD-10-CM | POA: Insufficient documentation

## 2018-10-25 DIAGNOSIS — I959 Hypotension, unspecified: Secondary | ICD-10-CM | POA: Diagnosis not present

## 2018-10-25 DIAGNOSIS — R42 Dizziness and giddiness: Secondary | ICD-10-CM | POA: Insufficient documentation

## 2018-10-25 HISTORY — DX: Unspecified atrial fibrillation: I48.91

## 2018-10-25 HISTORY — DX: Pure hypercholesterolemia, unspecified: E78.00

## 2018-10-25 HISTORY — DX: Unspecified asthma, uncomplicated: J45.909

## 2018-10-25 LAB — CBC WITH DIFFERENTIAL/PLATELET
Abs Immature Granulocytes: 0.05 10*3/uL (ref 0.00–0.07)
Basophils Absolute: 0.1 10*3/uL (ref 0.0–0.1)
Basophils Relative: 1 %
Eosinophils Absolute: 0.2 10*3/uL (ref 0.0–0.5)
Eosinophils Relative: 2 %
HCT: 35.5 % — ABNORMAL LOW (ref 39.0–52.0)
Hemoglobin: 11.2 g/dL — ABNORMAL LOW (ref 13.0–17.0)
Immature Granulocytes: 0 %
Lymphocytes Relative: 13 %
Lymphs Abs: 1.5 10*3/uL (ref 0.7–4.0)
MCH: 25.5 pg — ABNORMAL LOW (ref 26.0–34.0)
MCHC: 31.5 g/dL (ref 30.0–36.0)
MCV: 80.9 fL (ref 80.0–100.0)
Monocytes Absolute: 1.8 10*3/uL — ABNORMAL HIGH (ref 0.1–1.0)
Monocytes Relative: 15 %
Neutro Abs: 8 10*3/uL — ABNORMAL HIGH (ref 1.7–7.7)
Neutrophils Relative %: 69 %
Platelets: 441 10*3/uL — ABNORMAL HIGH (ref 150–400)
RBC: 4.39 MIL/uL (ref 4.22–5.81)
RDW: 16.8 % — ABNORMAL HIGH (ref 11.5–15.5)
WBC: 11.7 10*3/uL — ABNORMAL HIGH (ref 4.0–10.5)
nRBC: 0 % (ref 0.0–0.2)

## 2018-10-25 LAB — COMPREHENSIVE METABOLIC PANEL
ALT: 10 U/L (ref 0–44)
AST: 12 U/L — ABNORMAL LOW (ref 15–41)
Albumin: 3.4 g/dL — ABNORMAL LOW (ref 3.5–5.0)
Alkaline Phosphatase: 70 U/L (ref 38–126)
Anion gap: 7 (ref 5–15)
BUN: 14 mg/dL (ref 8–23)
CO2: 26 mmol/L (ref 22–32)
Calcium: 9.1 mg/dL (ref 8.9–10.3)
Chloride: 106 mmol/L (ref 98–111)
Creatinine, Ser: 1.32 mg/dL — ABNORMAL HIGH (ref 0.61–1.24)
GFR calc Af Amer: >60 mL/min (ref >60)
GFR calc non Af Amer: 57 mL/min — ABNORMAL LOW (ref >60)
Glucose, Bld: 96 mg/dL (ref 70–99)
Potassium: 4 mmol/L (ref 3.5–5.1)
Sodium: 139 mmol/L (ref 135–145)
Total Bilirubin: 0.6 mg/dL (ref 0.3–1.2)
Total Protein: 7.2 g/dL (ref 6.5–8.1)

## 2018-10-25 MED ORDER — SODIUM CHLORIDE 0.9 % IV BOLUS
500.0000 mL | Freq: Once | INTRAVENOUS | Status: AC
  Administered 2018-10-25: 11:00:00 500 mL via INTRAVENOUS

## 2018-10-25 NOTE — ED Notes (Signed)
Pt up using urinal at this time. This RN remains in room for safety. Pt states understanding.

## 2018-10-25 NOTE — ED Provider Notes (Signed)
[facility] Emergency Department Provider Note   ____________________________________________    I have reviewed the triage vital signs and the nursing notes.   HISTORY  Chief Complaint Dizziness     HPI Hector Pacheco is a 63 y.o. male who presents with complaints of lightheadedness.  Patient reports that he was mowing the lawn using a push mower in the high heat and humidity.  He went inside to take a break and drink some water, when he stood up he felt very lightheaded and had to sit back down again.  No chest pain no palpitations.  No nausea or vomiting.  No shortness of breath.  Currently reports he is feeling much better.  Denies fevers or chills.  No leg swelling.  Past Medical History:  Diagnosis Date  . Asthma   . Atrial fibrillation ([place])   . High cholesterol     There are no active problems to display for this patient.     Prior to Admission medications   Not on File     Allergies Penicillins  No family history on file.  Social History Social History   Tobacco Use  . Smoking status: Never Smoker  . Smokeless tobacco: Never Used  Substance Use Topics  . Alcohol use: Not Currently  . Drug use: Never    Review of Systems  Constitutional: No fever/chills Eyes: No visual changes.  ENT: No sore throat. Cardiovascular: Denies chest pain. Respiratory: Denies shortness of breath. Gastrointestinal: No abdominal pain.  No nausea, no vomiting.   Genitourinary: Negative for dysuria. Musculoskeletal: Negative for back pain. Skin: Negative for rash. Neurological: Negative for headaches or weakness   ____________________________________________   PHYSICAL EXAM:  VITAL SIGNS: ED Triage Vitals  Enc Vitals Group     BP 10/25/18 1012 105/68     Pulse Rate 10/25/18 1012 81     Resp 10/25/18 1012 18     Temp 10/25/18 1012 98.7 F (37.1 C)     Temp Source 10/25/18 1012 Oral     SpO2 10/25/18 1012 97 %     Weight  10/25/18 1013 72.6 kg (160 lb)     Height 10/25/18 1013 1.803 m (5\' 11" )     Head Circumference --      Peak Flow --      Pain Score 10/25/18 1013 0     Pain Loc --      Pain Edu? --      Excl. in [place]? --     Constitutional: Alert and oriented. No acute distress.  Eyes: Conjunctivae are normal.   Nose: No congestion/rhinnorhea. Mouth/Throat: Mucous membranes are moist.    Cardiovascular: Normal rate, regular rhythm. Grossly normal heart sounds.  Good peripheral circulation. Respiratory: Normal respiratory effort.  No retractions. Lungs CTAB. Gastrointestinal: Soft and nontender. No distention.  No CVA tenderness. Genitourinary: deferred Musculoskeletal: No lower extremity tenderness nor edema.  Warm and well perfused Neurologic:  Normal speech and language. No gross focal neurologic deficits are appreciated.  Skin:  Skin is warm, dry and intact. No rash noted. Psychiatric: Mood and affect are normal. Speech and behavior are normal.  ____________________________________________   LABS (all labs ordered are listed, but only abnormal results are displayed)  Labs Reviewed  CBC WITH DIFFERENTIAL/PLATELET - Abnormal; Notable for the following components:      Result Value   WBC 11.7 (*)    Hemoglobin 11.2 (*)    HCT 35.5 (*)    MCH 25.5 (*)  RDW 16.8 (*)    Platelets 441 (*)    Neutro Abs 8.0 (*)    Monocytes Absolute 1.8 (*)    All other components within normal limits  COMPREHENSIVE METABOLIC PANEL - Abnormal; Notable for the following components:   Creatinine, Ser 1.32 (*)    Albumin 3.4 (*)    AST 12 (*)    GFR calc non Af Amer 57 (*)    All other components within normal limits   ____________________________________________  EKG  ED ECG REPORT I, Lavonia Drafts, the attending physician, personally viewed and interpreted this ECG.  Date: 10/25/2018  Rhythm: normal sinus rhythm QRS Axis: normal Intervals: normal ST/T Wave abnormalities: normal Narrative  Interpretation: no evidence of acute ischemia  ____________________________________________  RADIOLOGY  None ____________________________________________   PROCEDURES  Procedure(s) performed: No  Procedures   Critical Care performed: No ____________________________________________   INITIAL IMPRESSION / ASSESSMENT AND PLAN / ED COURSE  Pertinent labs & imaging results that were available during my care of the patient were reviewed by me and considered in my medical decision making (see chart for details).  Patient presents with lightheadedness while exerting himself outside in high heat humidity, suspect heat exhaustion versus dehydration, will check labs, give IV fluids and reevaluate.  Patient reports he is feeling much better after IV fluids, no dizziness, normal orthostatics, blood work is quite reassuring.  He is appropriate for discharge at this time, return precautions discussed.    ____________________________________________   FINAL CLINICAL IMPRESSION(S) / ED DIAGNOSES  Final diagnoses:  Dizziness  Heat exhaustion, initial encounter        Note:  This document was prepared using Dragon voice recognition software and may include unintentional dictation errors.   Lavonia Drafts, MD 10/25/18 1504

## 2018-10-25 NOTE — ED Notes (Signed)
NAD noted at time of D/C. Pt denies questions or concerns. Pt ambulatory to the lobby at this time. Pt refused wheelchair to the lobby.  

## 2018-10-25 NOTE — ED Notes (Signed)
Pt sitting up on side of bed, NAD noted. Pt finishing 500cc bag fluid and awaiting his brother to return to pick him up.

## 2018-10-25 NOTE — ED Triage Notes (Signed)
Pt presents to ED via [facility] with c/o dizziness. Per EMS pt was mowing grass for approx 10 mins, sat down and became dizzy. Per EMS pt denies N/V/SOB/CP.   18g to L AC Sitting BP 92/60 Standing BP 82/58  500CC's fluid given en route

## 2018-11-01 DIAGNOSIS — E7849 Other hyperlipidemia: Secondary | ICD-10-CM | POA: Diagnosis not present

## 2018-11-01 DIAGNOSIS — R079 Chest pain, unspecified: Secondary | ICD-10-CM | POA: Diagnosis not present

## 2018-11-01 DIAGNOSIS — R0789 Other chest pain: Secondary | ICD-10-CM | POA: Diagnosis not present

## 2019-01-06 DIAGNOSIS — K219 Gastro-esophageal reflux disease without esophagitis: Secondary | ICD-10-CM | POA: Diagnosis not present

## 2019-01-13 DIAGNOSIS — Q8501 Neurofibromatosis, type 1: Secondary | ICD-10-CM | POA: Diagnosis not present

## 2019-01-13 DIAGNOSIS — J45901 Unspecified asthma with (acute) exacerbation: Secondary | ICD-10-CM | POA: Diagnosis not present

## 2019-01-13 DIAGNOSIS — F419 Anxiety disorder, unspecified: Secondary | ICD-10-CM | POA: Diagnosis not present

## 2019-01-13 DIAGNOSIS — E7849 Other hyperlipidemia: Secondary | ICD-10-CM | POA: Diagnosis not present

## 2019-01-13 DIAGNOSIS — Z23 Encounter for immunization: Secondary | ICD-10-CM | POA: Diagnosis not present

## 2019-01-13 DIAGNOSIS — K219 Gastro-esophageal reflux disease without esophagitis: Secondary | ICD-10-CM | POA: Diagnosis not present

## 2019-05-23 DIAGNOSIS — R0789 Other chest pain: Secondary | ICD-10-CM | POA: Diagnosis not present

## 2019-05-23 DIAGNOSIS — J45901 Unspecified asthma with (acute) exacerbation: Secondary | ICD-10-CM | POA: Diagnosis not present

## 2019-05-23 DIAGNOSIS — E7849 Other hyperlipidemia: Secondary | ICD-10-CM | POA: Diagnosis not present

## 2019-05-23 DIAGNOSIS — Q8501 Neurofibromatosis, type 1: Secondary | ICD-10-CM | POA: Diagnosis not present

## 2019-07-11 DIAGNOSIS — K219 Gastro-esophageal reflux disease without esophagitis: Secondary | ICD-10-CM | POA: Diagnosis not present

## 2019-07-11 DIAGNOSIS — Z125 Encounter for screening for malignant neoplasm of prostate: Secondary | ICD-10-CM | POA: Diagnosis not present

## 2019-07-11 DIAGNOSIS — E7849 Other hyperlipidemia: Secondary | ICD-10-CM | POA: Diagnosis not present

## 2019-07-18 DIAGNOSIS — Z0001 Encounter for general adult medical examination with abnormal findings: Secondary | ICD-10-CM | POA: Diagnosis not present

## 2019-07-18 DIAGNOSIS — E7849 Other hyperlipidemia: Secondary | ICD-10-CM | POA: Diagnosis not present

## 2019-07-18 DIAGNOSIS — K219 Gastro-esophageal reflux disease without esophagitis: Secondary | ICD-10-CM | POA: Diagnosis not present

## 2019-07-18 DIAGNOSIS — Z79899 Other long term (current) drug therapy: Secondary | ICD-10-CM | POA: Diagnosis not present

## 2019-07-18 DIAGNOSIS — Q8501 Neurofibromatosis, type 1: Secondary | ICD-10-CM | POA: Diagnosis not present

## 2019-07-18 DIAGNOSIS — M65342 Trigger finger, left ring finger: Secondary | ICD-10-CM | POA: Diagnosis not present

## 2019-07-18 DIAGNOSIS — J45901 Unspecified asthma with (acute) exacerbation: Secondary | ICD-10-CM | POA: Diagnosis not present

## 2019-08-05 DIAGNOSIS — M65342 Trigger finger, left ring finger: Secondary | ICD-10-CM | POA: Diagnosis not present

## 2019-09-12 DIAGNOSIS — H2513 Age-related nuclear cataract, bilateral: Secondary | ICD-10-CM | POA: Diagnosis not present

## 2020-01-09 DIAGNOSIS — K219 Gastro-esophageal reflux disease without esophagitis: Secondary | ICD-10-CM | POA: Diagnosis not present

## 2020-01-16 DIAGNOSIS — J45901 Unspecified asthma with (acute) exacerbation: Secondary | ICD-10-CM | POA: Diagnosis not present

## 2020-01-16 DIAGNOSIS — Z125 Encounter for screening for malignant neoplasm of prostate: Secondary | ICD-10-CM | POA: Diagnosis not present

## 2020-01-16 DIAGNOSIS — Z1211 Encounter for screening for malignant neoplasm of colon: Secondary | ICD-10-CM | POA: Diagnosis not present

## 2020-01-16 DIAGNOSIS — K219 Gastro-esophageal reflux disease without esophagitis: Secondary | ICD-10-CM | POA: Diagnosis not present

## 2020-01-16 DIAGNOSIS — E7849 Other hyperlipidemia: Secondary | ICD-10-CM | POA: Diagnosis not present

## 2020-01-16 DIAGNOSIS — Q8501 Neurofibromatosis, type 1: Secondary | ICD-10-CM | POA: Diagnosis not present

## 2020-05-22 DIAGNOSIS — R0789 Other chest pain: Secondary | ICD-10-CM | POA: Diagnosis not present

## 2020-05-22 DIAGNOSIS — E7849 Other hyperlipidemia: Secondary | ICD-10-CM | POA: Diagnosis not present

## 2020-06-15 ENCOUNTER — Other Ambulatory Visit: Payer: Self-pay

## 2020-06-15 ENCOUNTER — Other Ambulatory Visit
Admission: RE | Admit: 2020-06-15 | Discharge: 2020-06-15 | Disposition: A | Discharge status: Home / Self Care | Payer: Medicare HMO | Source: Ambulatory Visit | Attending: Gastroenterology | Admitting: Gastroenterology

## 2020-06-15 DIAGNOSIS — Z20822 Contact with and (suspected) exposure to covid-19: Secondary | ICD-10-CM | POA: Diagnosis not present

## 2020-06-15 DIAGNOSIS — Z01812 Encounter for preprocedural laboratory examination: Secondary | ICD-10-CM | POA: Insufficient documentation

## 2020-06-15 LAB — SARS CORONAVIRUS 2 (TAT 6-24 HRS): SARS Coronavirus 2: NEGATIVE

## 2020-06-18 ENCOUNTER — Encounter: Payer: Self-pay | Admitting: *Deleted

## 2020-06-19 ENCOUNTER — Encounter
Admission: RE | Disposition: A | Discharge status: Home / Self Care | Payer: Self-pay | Source: Home / Self Care | Attending: Gastroenterology

## 2020-06-19 ENCOUNTER — Ambulatory Visit: Payer: Medicare HMO | Admitting: Anesthesiology

## 2020-06-19 ENCOUNTER — Ambulatory Visit
Admission: RE | Admit: 2020-06-19 | Discharge: 2020-06-19 | Disposition: A | Discharge status: Home / Self Care | Payer: Medicare HMO | Attending: Gastroenterology | Admitting: Gastroenterology

## 2020-06-19 ENCOUNTER — Encounter: Payer: Self-pay | Admitting: *Deleted

## 2020-06-19 ENCOUNTER — Other Ambulatory Visit: Payer: Self-pay

## 2020-06-19 DIAGNOSIS — K640 First degree hemorrhoids: Secondary | ICD-10-CM | POA: Diagnosis not present

## 2020-06-19 DIAGNOSIS — K633 Ulcer of intestine: Secondary | ICD-10-CM | POA: Diagnosis not present

## 2020-06-19 DIAGNOSIS — Z79899 Other long term (current) drug therapy: Secondary | ICD-10-CM | POA: Diagnosis not present

## 2020-06-19 DIAGNOSIS — D124 Benign neoplasm of descending colon: Secondary | ICD-10-CM | POA: Insufficient documentation

## 2020-06-19 DIAGNOSIS — I10 Essential (primary) hypertension: Secondary | ICD-10-CM | POA: Diagnosis not present

## 2020-06-19 DIAGNOSIS — K6389 Other specified diseases of intestine: Secondary | ICD-10-CM | POA: Diagnosis not present

## 2020-06-19 DIAGNOSIS — K219 Gastro-esophageal reflux disease without esophagitis: Secondary | ICD-10-CM | POA: Diagnosis not present

## 2020-06-19 DIAGNOSIS — E785 Hyperlipidemia, unspecified: Secondary | ICD-10-CM | POA: Diagnosis not present

## 2020-06-19 DIAGNOSIS — Z7951 Long term (current) use of inhaled steroids: Secondary | ICD-10-CM | POA: Diagnosis not present

## 2020-06-19 DIAGNOSIS — D125 Benign neoplasm of sigmoid colon: Secondary | ICD-10-CM | POA: Insufficient documentation

## 2020-06-19 DIAGNOSIS — Z880 Allergy status to penicillin: Secondary | ICD-10-CM | POA: Diagnosis not present

## 2020-06-19 DIAGNOSIS — J45909 Unspecified asthma, uncomplicated: Secondary | ICD-10-CM | POA: Diagnosis not present

## 2020-06-19 DIAGNOSIS — Z1211 Encounter for screening for malignant neoplasm of colon: Secondary | ICD-10-CM | POA: Diagnosis not present

## 2020-06-19 DIAGNOSIS — K635 Polyp of colon: Secondary | ICD-10-CM | POA: Diagnosis not present

## 2020-06-19 HISTORY — DX: Essential (primary) hypertension: I10

## 2020-06-19 HISTORY — DX: Neurofibromatosis, unspecified: Q85.00

## 2020-06-19 HISTORY — PX: COLONOSCOPY WITH PROPOFOL: SHX5780

## 2020-06-19 HISTORY — DX: Allergic rhinitis, unspecified: J30.9

## 2020-06-19 HISTORY — DX: Other chest pain: R07.89

## 2020-06-19 HISTORY — DX: Gastro-esophageal reflux disease without esophagitis: K21.9

## 2020-06-19 HISTORY — DX: Calculus of kidney: N20.0

## 2020-06-19 SURGERY — COLONOSCOPY WITH PROPOFOL
Anesthesia: General

## 2020-06-19 MED ORDER — LIDOCAINE HCL (CARDIAC) PF 100 MG/5ML IV SOSY
PREFILLED_SYRINGE | INTRAVENOUS | Status: DC | PRN
  Administered 2020-06-19: 30 mg via INTRAVENOUS

## 2020-06-19 MED ORDER — SODIUM CHLORIDE 0.9 % IV SOLN
INTRAVENOUS | Status: DC
  Administered 2020-06-19: 1000 mL via INTRAVENOUS

## 2020-06-19 MED ORDER — PROPOFOL 10 MG/ML IV BOLUS
INTRAVENOUS | Status: DC | PRN
  Administered 2020-06-19 (×3): 20 mg via INTRAVENOUS
  Administered 2020-06-19: 50 mg via INTRAVENOUS
  Administered 2020-06-19 (×3): 20 mg via INTRAVENOUS
  Administered 2020-06-19: 50 mg via INTRAVENOUS
  Administered 2020-06-19 (×3): 20 mg via INTRAVENOUS

## 2020-06-19 MED ORDER — SPOT INK MARKER SYRINGE KIT
PACK | SUBMUCOSAL | Status: DC | PRN
  Administered 2020-06-19: .5 mL via SUBMUCOSAL

## 2020-06-19 MED ORDER — PROPOFOL 500 MG/50ML IV EMUL
INTRAVENOUS | Status: AC
  Filled 2020-06-19: qty 50

## 2020-06-19 NOTE — Interval H&P Note (Signed)
History and Physical Interval Note:  06/19/2020 1:37 PM  Hector Pacheco  has presented today for surgery, with the diagnosis of colon screen.  The various methods of treatment have been discussed with the patient and family. After consideration of risks, benefits and other options for treatment, the patient has consented to  Procedure(s): COLONOSCOPY WITH PROPOFOL (N/A) as a surgical intervention.  The patient's history has been reviewed, patient examined, no change in status, stable for surgery.  I have reviewed the patient's chart and labs.  Questions were answered to the patient's satisfaction.     Lesly Rubenstein  Ok to proceed with colonoscopy

## 2020-06-19 NOTE — Anesthesia Postprocedure Evaluation (Signed)
Anesthesia Post Note  Patient: Hector Pacheco  Procedure(s) Performed: COLONOSCOPY WITH PROPOFOL (N/A )  Patient location during evaluation: Endoscopy Anesthesia Type: General Level of consciousness: awake and alert and oriented Pain management: pain level controlled Vital Signs Assessment: post-procedure vital signs reviewed and stable Respiratory status: spontaneous breathing, nonlabored ventilation and respiratory function stable Cardiovascular status: blood pressure returned to baseline and stable Postop Assessment: no signs of nausea or vomiting Anesthetic complications: no   No complications documented.   Last Vitals:  Vitals:   06/19/20 1420 06/19/20 1440  BP: 107/83 (!) 114/54  Resp:    Temp:    SpO2:      Last Pain:  Vitals:   06/19/20 1440  TempSrc:   PainSc: 0-No pain                 Rocklyn Mayberry

## 2020-06-19 NOTE — Anesthesia Preprocedure Evaluation (Signed)
Anesthesia Evaluation  Patient identified by MRN, date of birth, ID band Patient awake    Reviewed: Allergy & Precautions, NPO status , Patient's Chart, lab work & pertinent test results  History of Anesthesia Complications Negative for: history of anesthetic complications  Airway Mallampati: II  TM Distance: >3 FB Neck ROM: Full    Dental  (+) Poor Dentition   Pulmonary asthma , neg sleep apnea,    breath sounds clear to auscultation- rhonchi (-) wheezing      Cardiovascular Exercise Tolerance: Good hypertension, Pt. on medications (-) CAD, (-) Past MI, (-) Cardiac Stents and (-) CABG + dysrhythmias Atrial Fibrillation  Rhythm:Regular Rate:Normal - Systolic murmurs and - Diastolic murmurs    Neuro/Psych neg Seizures negative neurological ROS  negative psych ROS   GI/Hepatic Neg liver ROS, GERD  ,  Endo/Other  negative endocrine ROSneg diabetes  Renal/GU Renal disease: hx of nephrolithiasis.     Musculoskeletal negative musculoskeletal ROS (+)   Abdominal (+) - obese,   Peds  Hematology negative hematology ROS (+)   Anesthesia Other Findings Past Medical History: No date: Allergic rhinitis No date: Asthma No date: Atrial fibrillation (H[facility]) No date: GERD (gastroesophageal reflux disease) No date: High cholesterol No date: Hypertension No date: Kidney stones No date: Neurofibromatosis ([place]) No date: Non-cardiac chest pain   Reproductive/Obstetrics                             Anesthesia Physical Anesthesia Plan  ASA: III  Anesthesia Plan: General   Post-op Pain Management:    Induction: Intravenous  PONV Risk Score and Plan: 1 and Propofol infusion  Airway Management Planned: Natural Airway  Additional Equipment:   Intra-op Plan:   Post-operative Plan:   Informed Consent: I have reviewed the patients History and Physical, chart, labs and discussed the procedure  including the risks, benefits and alternatives for the proposed anesthesia with the patient or authorized representative who has indicated his/her understanding and acceptance.     Dental advisory given  Plan Discussed with: CRNA and Anesthesiologist  Anesthesia Plan Comments:         Anesthesia Quick Evaluation

## 2020-06-19 NOTE — Transfer of Care (Signed)
Immediate Anesthesia Transfer of Care Note  Patient: Hector Pacheco  Procedure(s) Performed: COLONOSCOPY WITH PROPOFOL (N/A )  Patient Location: PACU and Endoscopy Unit  Anesthesia Type:MAC and General  Level of Consciousness: awake, alert  and oriented  Airway & Oxygen Therapy: Patient Spontanous Breathing  Post-op Assessment: Report given to RN and Post -op Vital signs reviewed and stable  Post vital signs: Reviewed and stable  Last Vitals:  Vitals Value Taken Time  BP 89/62 06/19/20 1411  Temp    Pulse 74 06/19/20 1413  Resp 15 06/19/20 1413  SpO2 99 % 06/19/20 1413  Vitals shown include unvalidated device data.  Last Pain:  Vitals:   06/19/20 1240  TempSrc: Temporal  PainSc: 0-No pain      Patients Stated Pain Goal: 0 (76/19/50 9326)  Complications: No complications documented.

## 2020-06-19 NOTE — Op Note (Signed)
[facility] Gastroenterology Patient Name: Hector Pacheco Procedure Date: 06/19/2020 1:39 PM MRN: [id number] Account #: [id number] Date of Birth: 07-03-1955 Admit Type: Outpatient Age: 65 Room: [facility] ENDO ROOM 1 Gender: Male Note Status: Finalized Procedure:             Colonoscopy Indications:           Screening for colorectal malignant neoplasm Providers:             Andrey Farmer MD, MD Referring MD:          Baxter Hire, MD (Referring MD) Medicines:             Monitored Anesthesia Care Complications:         No immediate complications. Estimated blood loss:                         Minimal. Procedure:             Pre-Anesthesia Assessment:                        - Prior to the procedure, a History and Physical was                         performed, and patient medications and allergies were                         reviewed. The patient is competent. The risks and                         benefits of the procedure and the sedation options and                         risks were discussed with the patient. All questions                         were answered and informed consent was obtained.                         Patient identification and proposed procedure were                         verified by the physician, the nurse, the anesthetist                         and the technician in the endoscopy suite. Mental                         Status Examination: alert and oriented. Airway                         Examination: normal oropharyngeal airway and neck                         mobility. Respiratory Examination: clear to                         auscultation. CV Examination: normal. Prophylactic  Antibiotics: The patient does not require prophylactic                         antibiotics. Prior Anticoagulants: The patient has                         taken no previous anticoagulant or antiplatelet                         agents. ASA  Grade Assessment: II - A patient with mild                         systemic disease. After reviewing the risks and                         benefits, the patient was deemed in satisfactory                         condition to undergo the procedure. The anesthesia                         plan was to use monitored anesthesia care (MAC).                         Immediately prior to administration of medications,                         the patient was re-assessed for adequacy to receive                         sedatives. The heart rate, respiratory rate, oxygen                         saturations, blood pressure, adequacy of pulmonary                         ventilation, and response to care were monitored                         throughout the procedure. The physical status of the                         patient was re-assessed after the procedure.                        After obtaining informed consent, the colonoscope was                         passed under direct vision. Throughout the procedure,                         the patient's blood pressure, pulse, and oxygen                         saturations were monitored continuously. The                         Colonoscope was introduced through the anus and  advanced to the the cecum, identified by appendiceal                         orifice and ileocecal valve. The colonoscopy was                         performed without difficulty. The patient tolerated                         the procedure well. The quality of the bowel                         preparation was good. Findings:      The perianal and digital rectal examinations were normal.      A 27 mm polyp was found in the proximal sigmoid colon. The polyp was       pedunculated. The polyp was removed with a hot snare. Resection and       retrieval were complete. To prevent bleeding after the polypectomy, one       hemostatic clip was successfully placed. There was  no bleeding during,       or at the end, of the procedure. Area was tattooed with an injection of       [place] ink.      A t the stalk base there was an area of granular mucosa was found in the       proximal sigmoid colon. Biopsies were taken with a cold forceps for       histology. Estimated blood loss was minimal.      Internal hemorrhoids were found during retroflexion. The hemorrhoids       were Grade I (internal hemorrhoids that do not prolapse).      The exam was otherwise without abnormality on direct and retroflexion       views. Impression:            - One 27 mm polyp in the proximal sigmoid colon,                         removed with a hot snare. Resected and retrieved. Clip                         was placed. Tattooed.                        - Granularity in the proximal sigmoid colon. Biopsied.                        - Internal hemorrhoids.                        - The examination was otherwise normal on direct and                         retroflexion views. Recommendation:        - Discharge patient to home.                        - Resume previous diet.                        -  Continue present medications.                        - Await pathology results.                        - Repeat colonoscopy for surveillance based on                         pathology results.                        - Return to referring physician as previously                         scheduled. Procedure Code(s):     --- Professional ---                        [id number], Colonoscopy, flexible; with removal of                         tumor(s), polyp(s), or other lesion(s) by snare                         technique                        45381, Colonoscopy, flexible; with directed submucosal                         injection(s), any substance                        24462, 37, Colonoscopy, flexible; with biopsy, single                         or multiple Diagnosis Code(s):     --- Professional ---                         Z12.11, Encounter for screening for malignant neoplasm                         of colon                        K63.5, Polyp of colon                        K63.89, Other specified diseases of intestine                        K64.0, First degree hemorrhoids CPT copyright 2019 American Medical Association. All rights reserved. The codes documented in this report are preliminary and upon coder review may  be revised to meet current compliance requirements. Andrey Farmer MD, MD 06/19/2020 2:13:22 PM Number of Addenda: 0 Note Initiated On: 06/19/2020 1:39 PM Scope Withdrawal Time: 0 hours 17 minutes 28 seconds  Total Procedure Duration: 0 hours 19 minutes 39 seconds  Estimated Blood Loss:  Estimated blood loss was minimal. Estimated blood loss                         was  minimal.      [facility]

## 2020-06-19 NOTE — H&P (Signed)
Outpatient short stay form Pre-procedure 06/19/2020 1:34 PM Hector Miyamoto MD, MPH  Primary Physician: [facility]  Reason for visit:  Screening Colonoscopy  History of present illness:   65 y/o gentleman with history of GERD and HLD here for screening colonoscopy. No family history of GI malignancies. Never had colonoscopy before. History of abdominal surgery for neurofibromatosis. No blood thinners.    Current Facility-Administered Medications:  .  0.9 %  sodium chloride infusion, , Intravenous, Continuous, Prajwal Fellner, Hilton Cork, MD, Last Rate: 20 mL/hr at 06/19/20 1305, 1,000 mL at 06/19/20 1305  Medications Prior to Admission  Medication Sig Dispense Refill Last Dose  . acetaminophen (TYLENOL) 500 MG tablet Take 500 mg by mouth every 6 (six) hours as needed.   Past Week at Unknown time  . albuterol (VENTOLIN HFA) 108 (90 Base) MCG/ACT inhaler Inhale into the lungs every 6 (six) hours as needed for wheezing or shortness of breath.     . clonazePAM (KLONOPIN) 0.5 MG tablet Take 0.5 mg by mouth at bedtime as needed for anxiety.   Past Week at Unknown time  . ferrous sulfate 324 MG TBEC Take 324 mg by mouth daily with breakfast.   Past Week at Unknown time  . metoprolol succinate (TOPROL-XL) 25 MG 24 hr tablet Take 25 mg by mouth daily.   06/19/2020 at 1100am  . omeprazole (PRILOSEC) 20 MG capsule Take 20 mg by mouth daily.   Past Week at Unknown time  . simvastatin (ZOCOR) 20 MG tablet Take 20 mg by mouth daily at 6 PM.   06/19/2020 at Unknown time     Allergies  Allergen Reactions  . Amoxicillin Swelling    Throat swelling  . Penicillins Nausea And Vomiting     Past Medical History:  Diagnosis Date  . Allergic rhinitis   . Asthma   . Atrial fibrillation ([place])   . GERD (gastroesophageal reflux disease)   . High cholesterol   . Hypertension   . Kidney stones   . Neurofibromatosis ([place])   . Non-cardiac chest pain     Review of systems:  Otherwise negative.    Physical  Exam  Gen: Alert, oriented. Appears stated age.  HEENT: PERRLA. Lungs: No respiratory distress CV: RRR Abd: soft, benign, no masses Ext: No edema    Planned procedures: Proceed with colonoscopy. The patient understands the nature of the planned procedure, indications, risks, alternatives and potential complications including but not limited to bleeding, infection, perforation, damage to internal organs and possible oversedation/side effects from anesthesia. The patient agrees and gives consent to proceed.  Please refer to procedure notes for findings, recommendations and patient disposition/instructions.     Hector Miyamoto MD, MPH Gastroenterology 06/19/2020  1:34 PM

## 2020-06-21 LAB — SURGICAL PATHOLOGY

## 2020-07-23 DIAGNOSIS — Z23 Encounter for immunization: Secondary | ICD-10-CM | POA: Diagnosis not present

## 2020-07-23 DIAGNOSIS — K219 Gastro-esophageal reflux disease without esophagitis: Secondary | ICD-10-CM | POA: Diagnosis not present

## 2020-07-23 DIAGNOSIS — J45901 Unspecified asthma with (acute) exacerbation: Secondary | ICD-10-CM | POA: Diagnosis not present

## 2020-07-23 DIAGNOSIS — Z0001 Encounter for general adult medical examination with abnormal findings: Secondary | ICD-10-CM | POA: Diagnosis not present

## 2020-07-23 DIAGNOSIS — E7849 Other hyperlipidemia: Secondary | ICD-10-CM | POA: Diagnosis not present

## 2020-07-23 DIAGNOSIS — Q8501 Neurofibromatosis, type 1: Secondary | ICD-10-CM | POA: Diagnosis not present

## 2020-07-23 DIAGNOSIS — Z0000 Encounter for general adult medical examination without abnormal findings: Secondary | ICD-10-CM | POA: Diagnosis not present

## 2020-07-23 DIAGNOSIS — Z125 Encounter for screening for malignant neoplasm of prostate: Secondary | ICD-10-CM | POA: Diagnosis not present

## 2020-08-24 ENCOUNTER — Emergency Department: Payer: Medicare HMO

## 2020-08-24 ENCOUNTER — Emergency Department
Admission: EM | Admit: 2020-08-24 | Discharge: 2020-08-25 | Disposition: A | Discharge status: Home / Self Care | Payer: Medicare HMO | Attending: Emergency Medicine | Admitting: Emergency Medicine

## 2020-08-24 ENCOUNTER — Other Ambulatory Visit: Payer: Self-pay

## 2020-08-24 DIAGNOSIS — Z5321 Procedure and treatment not carried out due to patient leaving prior to being seen by health care provider: Secondary | ICD-10-CM | POA: Insufficient documentation

## 2020-08-24 DIAGNOSIS — I959 Hypotension, unspecified: Secondary | ICD-10-CM | POA: Insufficient documentation

## 2020-08-24 DIAGNOSIS — W19XXXA Unspecified fall, initial encounter: Secondary | ICD-10-CM | POA: Diagnosis not present

## 2020-08-24 DIAGNOSIS — R42 Dizziness and giddiness: Secondary | ICD-10-CM | POA: Diagnosis not present

## 2020-08-24 DIAGNOSIS — S0990XA Unspecified injury of head, initial encounter: Secondary | ICD-10-CM | POA: Diagnosis not present

## 2020-08-24 DIAGNOSIS — R55 Syncope and collapse: Secondary | ICD-10-CM | POA: Insufficient documentation

## 2020-08-24 LAB — CBC
HCT: 43.8 % (ref 39.0–52.0)
Hemoglobin: 13.8 g/dL (ref 13.0–17.0)
MCH: 25.5 pg — ABNORMAL LOW (ref 26.0–34.0)
MCHC: 31.5 g/dL (ref 30.0–36.0)
MCV: 81 fL (ref 80.0–100.0)
Platelets: 303 10*3/uL (ref 150–400)
RBC: 5.41 MIL/uL (ref 4.22–5.81)
RDW: 19.4 % — ABNORMAL HIGH (ref 11.5–15.5)
WBC: 10.2 10*3/uL (ref 4.0–10.5)
nRBC: 0 % (ref 0.0–0.2)

## 2020-08-24 LAB — BASIC METABOLIC PANEL
Anion gap: 6 (ref 5–15)
BUN: 17 mg/dL (ref 8–23)
CO2: 24 mmol/L (ref 22–32)
Calcium: 9 mg/dL (ref 8.9–10.3)
Chloride: 106 mmol/L (ref 98–111)
Creatinine, Ser: 1.38 mg/dL — ABNORMAL HIGH (ref 0.61–1.24)
GFR, Estimated: 57 mL/min — ABNORMAL LOW (ref >60)
Glucose, Bld: 112 mg/dL — ABNORMAL HIGH (ref 70–99)
Potassium: 4.5 mmol/L (ref 3.5–5.1)
Sodium: 136 mmol/L (ref 135–145)

## 2020-08-24 NOTE — ED Notes (Signed)
Pt states is leaving. Pt ambulatory out of door without difficulty.

## 2020-08-24 NOTE — ED Triage Notes (Signed)
Pt presents to ER with complaints of syncopal of epidose after attending a baseball game. Pt reports he passed out hitting his head. Pt is awake, alert and oriented at present. Denies pain or discomfort.. pt talks in complete sentences.

## 2020-08-24 NOTE — ED Triage Notes (Signed)
Pt comes into the ED via [facility] from baseball game c/o near syncopal episode.  Pt did stumble and hit his head.  Pt denies any blood thinner use.  Upon EMS arrival patient was hypotensive at diastolic 62.  Pt given 500 ml NaCl.  CBG 187 97%.  18 g R hand.  Last pressure received was 114/68.

## 2020-08-30 DIAGNOSIS — R55 Syncope and collapse: Secondary | ICD-10-CM | POA: Diagnosis not present

## 2020-09-24 DIAGNOSIS — H2513 Age-related nuclear cataract, bilateral: Secondary | ICD-10-CM | POA: Diagnosis not present

## 2021-01-21 DIAGNOSIS — K219 Gastro-esophageal reflux disease without esophagitis: Secondary | ICD-10-CM | POA: Diagnosis not present

## 2021-01-23 DIAGNOSIS — K219 Gastro-esophageal reflux disease without esophagitis: Secondary | ICD-10-CM | POA: Diagnosis not present

## 2021-01-23 DIAGNOSIS — J45901 Unspecified asthma with (acute) exacerbation: Secondary | ICD-10-CM | POA: Diagnosis not present

## 2021-01-23 DIAGNOSIS — Q8501 Neurofibromatosis, type 1: Secondary | ICD-10-CM | POA: Diagnosis not present

## 2021-01-23 DIAGNOSIS — E7849 Other hyperlipidemia: Secondary | ICD-10-CM | POA: Diagnosis not present

## 2021-07-17 DIAGNOSIS — E7849 Other hyperlipidemia: Secondary | ICD-10-CM | POA: Diagnosis not present

## 2021-07-24 DIAGNOSIS — Z0000 Encounter for general adult medical examination without abnormal findings: Secondary | ICD-10-CM | POA: Diagnosis not present

## 2021-07-24 DIAGNOSIS — J45901 Unspecified asthma with (acute) exacerbation: Secondary | ICD-10-CM | POA: Diagnosis not present

## 2021-07-24 DIAGNOSIS — E7849 Other hyperlipidemia: Secondary | ICD-10-CM | POA: Diagnosis not present

## 2021-07-24 DIAGNOSIS — Q8501 Neurofibromatosis, type 1: Secondary | ICD-10-CM | POA: Diagnosis not present

## 2021-07-24 DIAGNOSIS — Z1389 Encounter for screening for other disorder: Secondary | ICD-10-CM | POA: Diagnosis not present

## 2021-07-24 DIAGNOSIS — Z0001 Encounter for general adult medical examination with abnormal findings: Secondary | ICD-10-CM | POA: Diagnosis not present

## 2021-10-01 DIAGNOSIS — H2513 Age-related nuclear cataract, bilateral: Secondary | ICD-10-CM | POA: Diagnosis not present

## 2021-10-02 DIAGNOSIS — Z0100 Encounter for examination of eyes and vision without abnormal findings: Secondary | ICD-10-CM | POA: Diagnosis not present

## 2022-01-21 DIAGNOSIS — E7849 Other hyperlipidemia: Secondary | ICD-10-CM | POA: Diagnosis not present

## 2022-01-23 DIAGNOSIS — Q8501 Neurofibromatosis, type 1: Secondary | ICD-10-CM | POA: Diagnosis not present

## 2022-01-23 DIAGNOSIS — E7849 Other hyperlipidemia: Secondary | ICD-10-CM | POA: Diagnosis not present

## 2022-01-23 DIAGNOSIS — K219 Gastro-esophageal reflux disease without esophagitis: Secondary | ICD-10-CM | POA: Diagnosis not present

## 2022-07-22 DIAGNOSIS — Z125 Encounter for screening for malignant neoplasm of prostate: Secondary | ICD-10-CM | POA: Diagnosis not present

## 2022-07-22 DIAGNOSIS — E7849 Other hyperlipidemia: Secondary | ICD-10-CM | POA: Diagnosis not present

## 2022-07-29 DIAGNOSIS — E7849 Other hyperlipidemia: Secondary | ICD-10-CM | POA: Diagnosis not present

## 2022-07-29 DIAGNOSIS — K219 Gastro-esophageal reflux disease without esophagitis: Secondary | ICD-10-CM | POA: Diagnosis not present

## 2022-07-29 DIAGNOSIS — Q8501 Neurofibromatosis, type 1: Secondary | ICD-10-CM | POA: Diagnosis not present

## 2022-07-29 DIAGNOSIS — Z1331 Encounter for screening for depression: Secondary | ICD-10-CM | POA: Diagnosis not present

## 2022-07-29 DIAGNOSIS — J45909 Unspecified asthma, uncomplicated: Secondary | ICD-10-CM | POA: Diagnosis not present

## 2022-07-29 DIAGNOSIS — Z0000 Encounter for general adult medical examination without abnormal findings: Secondary | ICD-10-CM | POA: Diagnosis not present

## 2022-07-29 DIAGNOSIS — Z0001 Encounter for general adult medical examination with abnormal findings: Secondary | ICD-10-CM | POA: Diagnosis not present

## 2022-10-07 DIAGNOSIS — H2513 Age-related nuclear cataract, bilateral: Secondary | ICD-10-CM | POA: Diagnosis not present

## 2022-11-03 DIAGNOSIS — I4510 Unspecified right bundle-branch block: Secondary | ICD-10-CM | POA: Diagnosis not present

## 2022-11-03 DIAGNOSIS — J45909 Unspecified asthma, uncomplicated: Secondary | ICD-10-CM | POA: Diagnosis not present

## 2022-11-03 DIAGNOSIS — R739 Hyperglycemia, unspecified: Secondary | ICD-10-CM | POA: Diagnosis not present

## 2022-11-03 DIAGNOSIS — R079 Chest pain, unspecified: Secondary | ICD-10-CM | POA: Diagnosis not present

## 2022-11-03 DIAGNOSIS — R002 Palpitations: Secondary | ICD-10-CM | POA: Diagnosis not present

## 2022-11-03 DIAGNOSIS — K449 Diaphragmatic hernia without obstruction or gangrene: Secondary | ICD-10-CM | POA: Diagnosis not present

## 2022-11-03 DIAGNOSIS — E785 Hyperlipidemia, unspecified: Secondary | ICD-10-CM | POA: Diagnosis not present

## 2022-11-03 DIAGNOSIS — I4891 Unspecified atrial fibrillation: Secondary | ICD-10-CM | POA: Diagnosis not present

## 2022-11-03 DIAGNOSIS — R0789 Other chest pain: Secondary | ICD-10-CM | POA: Diagnosis not present

## 2022-11-06 DIAGNOSIS — Z09 Encounter for follow-up examination after completed treatment for conditions other than malignant neoplasm: Secondary | ICD-10-CM | POA: Diagnosis not present

## 2022-11-06 DIAGNOSIS — J45909 Unspecified asthma, uncomplicated: Secondary | ICD-10-CM | POA: Diagnosis not present

## 2022-11-06 DIAGNOSIS — K449 Diaphragmatic hernia without obstruction or gangrene: Secondary | ICD-10-CM | POA: Diagnosis not present

## 2022-12-11 IMAGING — CT CT HEAD W/O CM
3 series · 15 of 47 positions shown, 18 images · non-contrast
Comparison: None.

CLINICAL DATA: Syncopal episode. Hit head.

EXAM:
CT HEAD WITHOUT CONTRAST
TECHNIQUE: Contiguous axial images were obtained from the base of the skull
through the vertex without intravenous contrast.

[Series 2: head wo · axial · 0.42mm/px · z∈[+332,+457]mm · 9 of 31 slices shown, 12 images]
[im 3/31  brain]
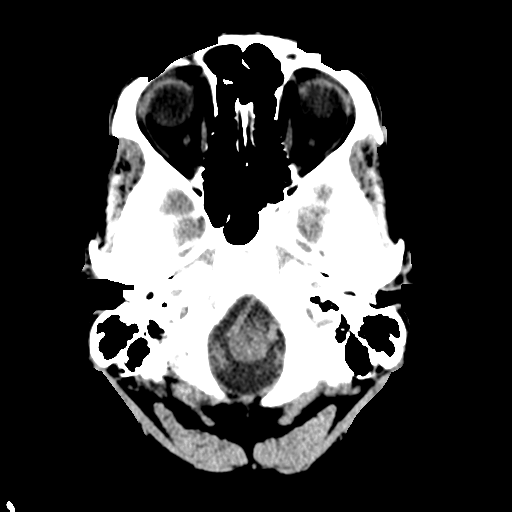
[im 3/31  bone]
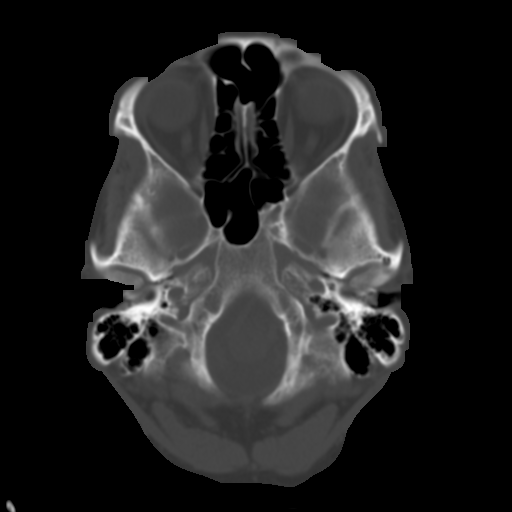
[im 6/31  brain]
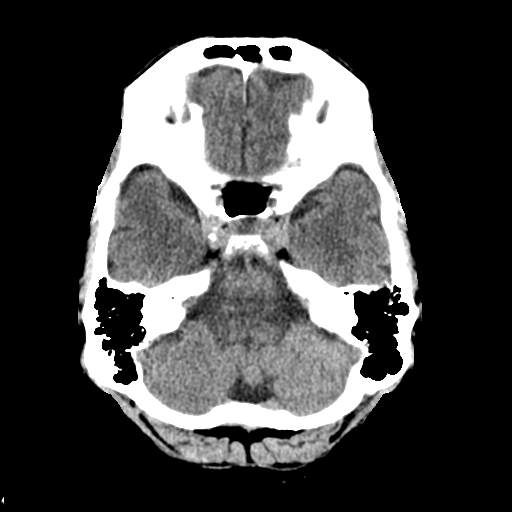
[im 9/31  brain]
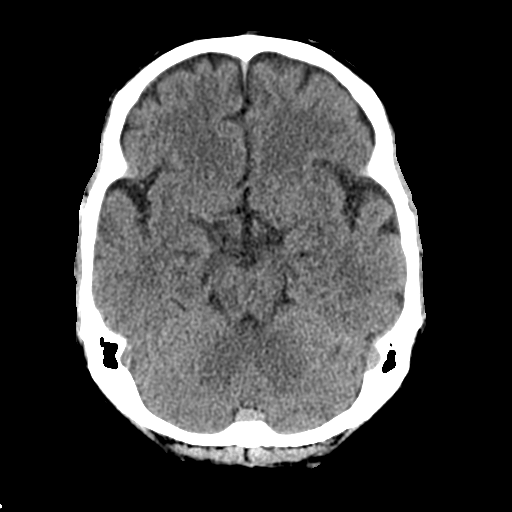
[im 12/31  brain]
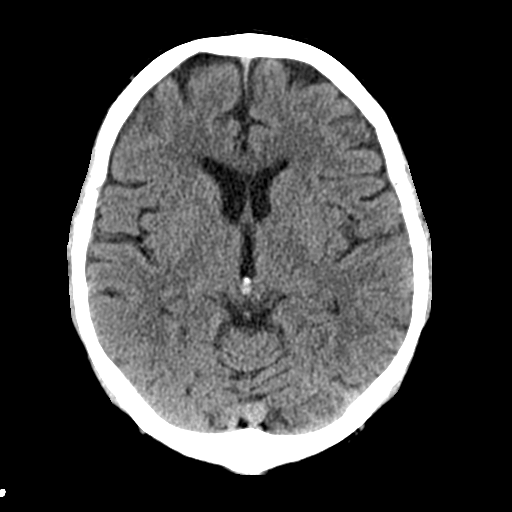
[im 16/31  brain]
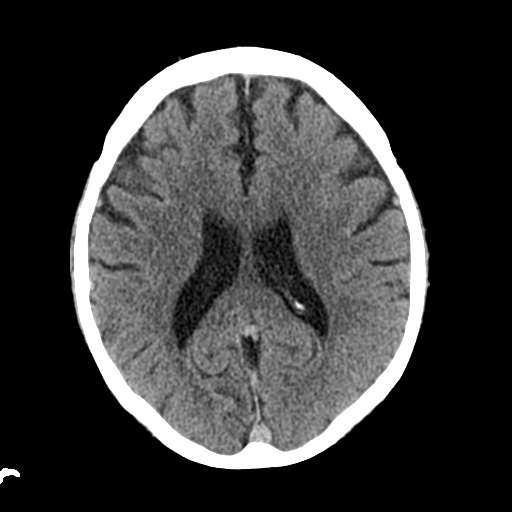
[im 16/31  bone]
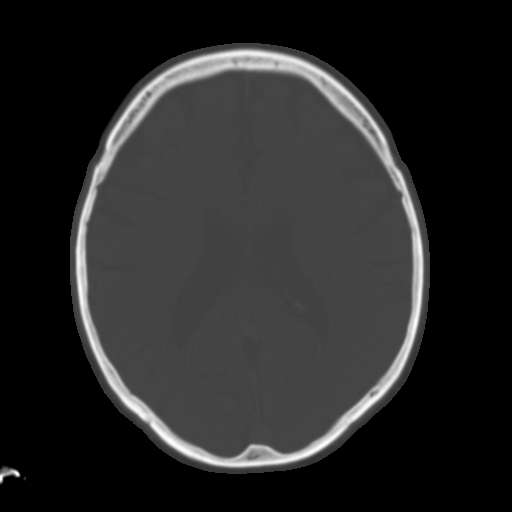
[im 19/31  brain]
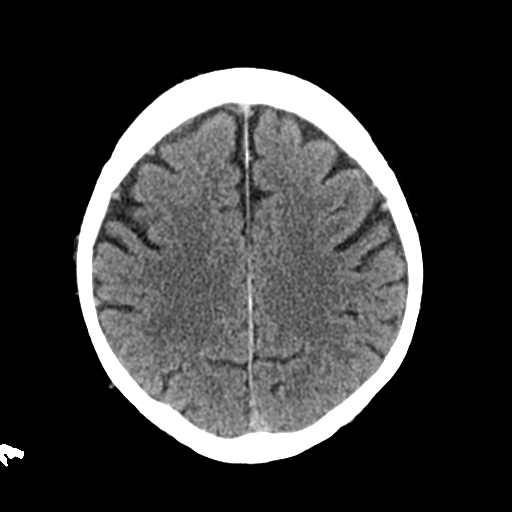
[im 22/31  brain]
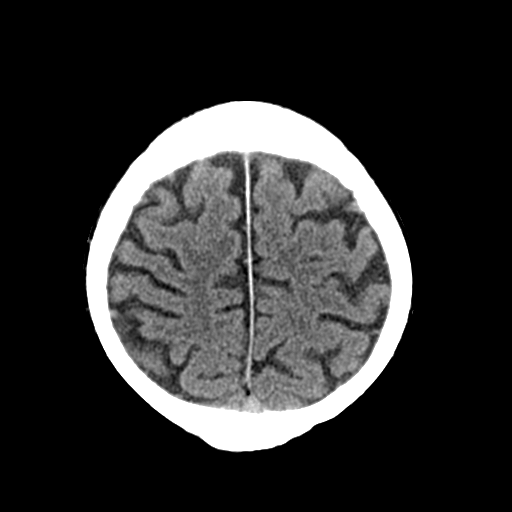
[im 25/31  brain]
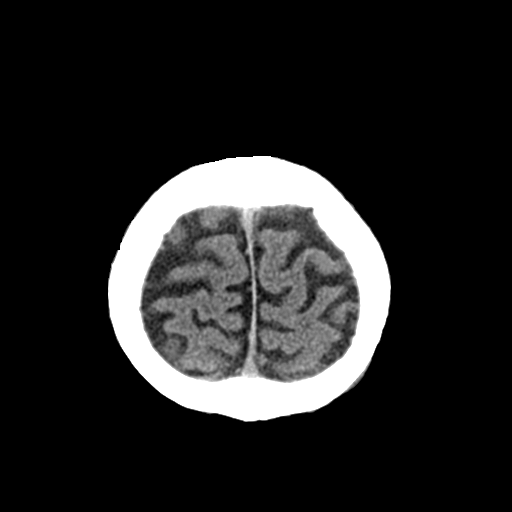
[im 28/31  brain]
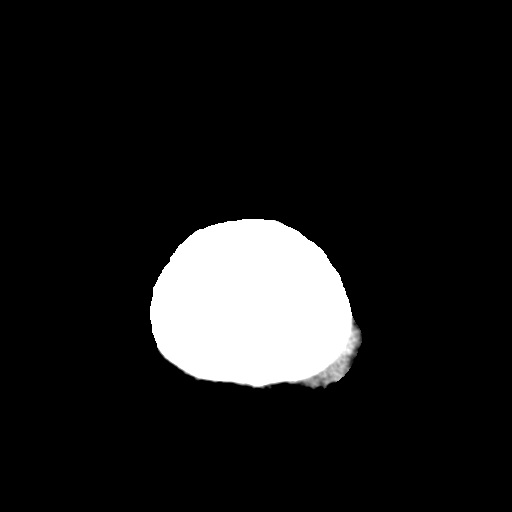
[im 28/31  bone]
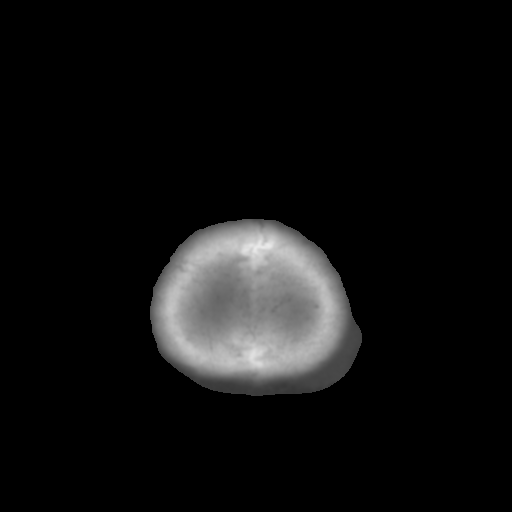

[Series 4: coronal soft tissue · coronal · 0.31mm/px · 3 of 65 slices shown]
[im 22/65  brain]
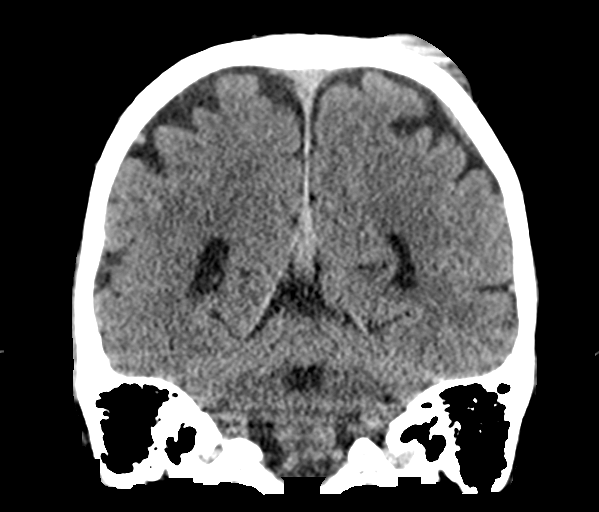
[im 29/65  brain]
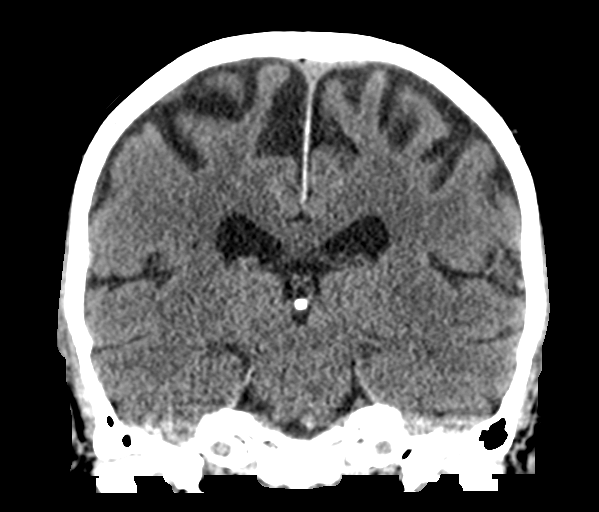
[im 36/65  brain]
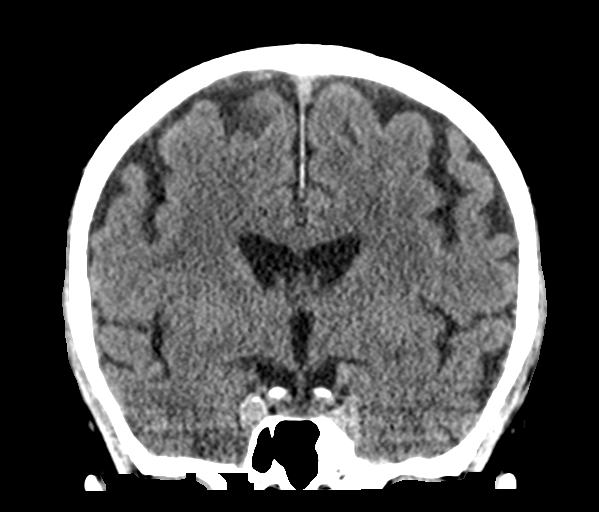

[Series 5: sagittal soft tissue · sagittal · 0.31mm/px · 3 of 57 slices shown]
[im 19/57  brain]
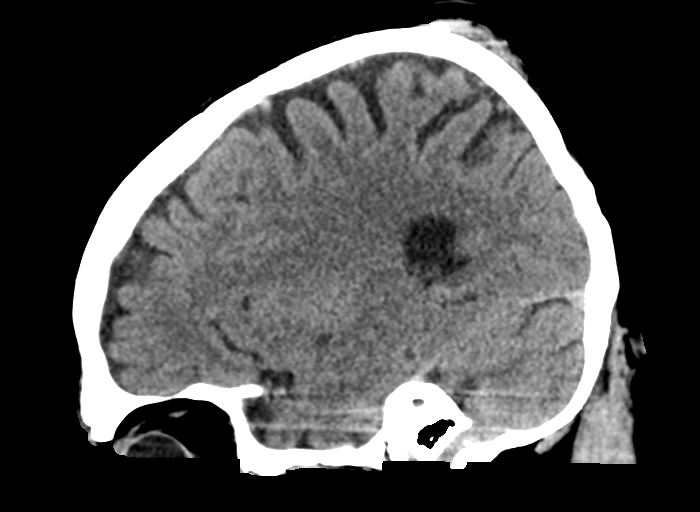
[im 29/57  brain]
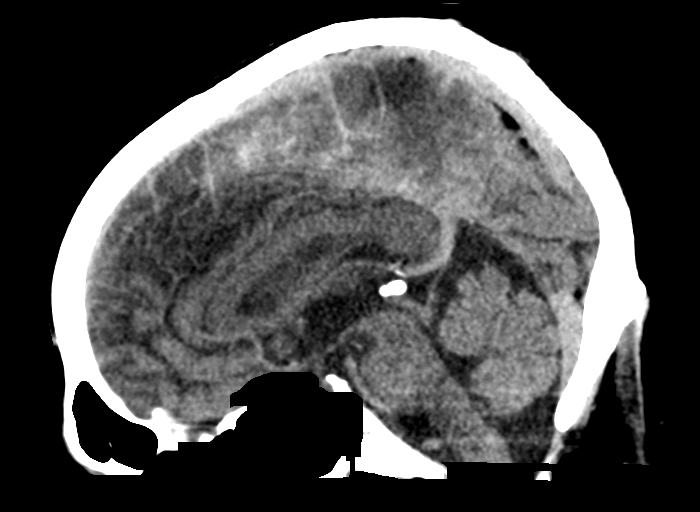
[im 38/57  brain]
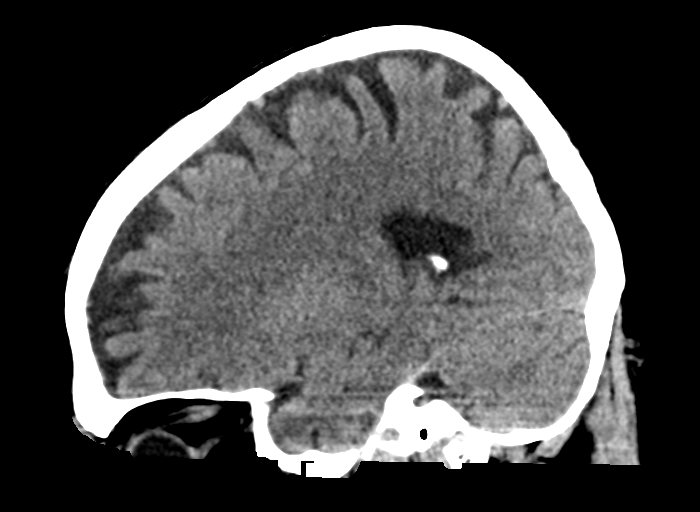

[15 of 47 positions shown; findings below may reference images not displayed]

FINDINGS: Brain: The ventricles are normal in size and configuration. No
extra-axial fluid collections are identified. The gray-white
differentiation is maintained. No CT findings for acute hemispheric
infarction or intracranial hemorrhage. No mass lesions. The
brainstem and cerebellum are normal.

Vascular: No hyperdense vessels or obvious aneurysm.

Skull: No acute skull fracture. No bone lesion.

Sinuses/Orbits: The paranasal sinuses and mastoid air cells are
clear. The globes are intact.

Other: No scalp lesions, laceration or hematoma.
IMPRESSION: Normal head CT.

## 2022-12-15 DIAGNOSIS — R0789 Other chest pain: Secondary | ICD-10-CM | POA: Diagnosis not present

## 2022-12-15 DIAGNOSIS — E7849 Other hyperlipidemia: Secondary | ICD-10-CM | POA: Diagnosis not present

## 2023-01-28 DIAGNOSIS — E7849 Other hyperlipidemia: Secondary | ICD-10-CM | POA: Diagnosis not present

## 2023-01-29 DIAGNOSIS — J45901 Unspecified asthma with (acute) exacerbation: Secondary | ICD-10-CM | POA: Diagnosis not present

## 2023-01-29 DIAGNOSIS — Q8501 Neurofibromatosis, type 1: Secondary | ICD-10-CM | POA: Diagnosis not present

## 2023-01-29 DIAGNOSIS — K219 Gastro-esophageal reflux disease without esophagitis: Secondary | ICD-10-CM | POA: Diagnosis not present

## 2023-01-29 DIAGNOSIS — E7849 Other hyperlipidemia: Secondary | ICD-10-CM | POA: Diagnosis not present

## 2023-02-02 ENCOUNTER — Emergency Department: Payer: Medicare HMO

## 2023-02-02 ENCOUNTER — Other Ambulatory Visit: Payer: Self-pay

## 2023-02-02 ENCOUNTER — Emergency Department
Admission: EM | Admit: 2023-02-02 | Discharge: 2023-02-02 | Disposition: A | Discharge status: Home / Self Care | Payer: Medicare HMO | Attending: Student in an Organized Health Care Education/Training Program | Admitting: Student in an Organized Health Care Education/Training Program

## 2023-02-02 DIAGNOSIS — J45909 Unspecified asthma, uncomplicated: Secondary | ICD-10-CM | POA: Insufficient documentation

## 2023-02-02 DIAGNOSIS — N201 Calculus of ureter: Secondary | ICD-10-CM | POA: Diagnosis not present

## 2023-02-02 DIAGNOSIS — R079 Chest pain, unspecified: Secondary | ICD-10-CM | POA: Diagnosis not present

## 2023-02-02 DIAGNOSIS — I4891 Unspecified atrial fibrillation: Secondary | ICD-10-CM | POA: Diagnosis not present

## 2023-02-02 DIAGNOSIS — N400 Enlarged prostate without lower urinary tract symptoms: Secondary | ICD-10-CM | POA: Diagnosis not present

## 2023-02-02 DIAGNOSIS — I10 Essential (primary) hypertension: Secondary | ICD-10-CM | POA: Diagnosis not present

## 2023-02-02 DIAGNOSIS — Z7901 Long term (current) use of anticoagulants: Secondary | ICD-10-CM | POA: Insufficient documentation

## 2023-02-02 DIAGNOSIS — Z79899 Other long term (current) drug therapy: Secondary | ICD-10-CM | POA: Insufficient documentation

## 2023-02-02 DIAGNOSIS — R0789 Other chest pain: Secondary | ICD-10-CM | POA: Diagnosis not present

## 2023-02-02 DIAGNOSIS — K449 Diaphragmatic hernia without obstruction or gangrene: Secondary | ICD-10-CM | POA: Insufficient documentation

## 2023-02-02 DIAGNOSIS — R1111 Vomiting without nausea: Secondary | ICD-10-CM | POA: Diagnosis not present

## 2023-02-02 LAB — URINALYSIS, ROUTINE W REFLEX MICROSCOPIC
Bacteria, UA: NONE SEEN
Bilirubin Urine: NEGATIVE
Glucose, UA: NEGATIVE mg/dL
Ketones, ur: 20 mg/dL — AB
Leukocytes,Ua: NEGATIVE
Nitrite: NEGATIVE
Protein, ur: NEGATIVE mg/dL
Specific Gravity, Urine: >1.046 — ABNORMAL HIGH (ref 1.005–1.030)
Squamous Epithelial / HPF: 0 /[HPF] (ref 0–5)
pH: 6 (ref 5.0–8.0)

## 2023-02-02 LAB — BASIC METABOLIC PANEL
Anion gap: 12 (ref 5–15)
BUN: 18 mg/dL (ref 8–23)
CO2: 24 mmol/L (ref 22–32)
Calcium: 10.1 mg/dL (ref 8.9–10.3)
Chloride: 102 mmol/L (ref 98–111)
Creatinine, Ser: 1.1 mg/dL (ref 0.61–1.24)
GFR, Estimated: >60 mL/min (ref >60)
Glucose, Bld: 184 mg/dL — ABNORMAL HIGH (ref 70–99)
Potassium: 4.8 mmol/L (ref 3.5–5.1)
Sodium: 138 mmol/L (ref 135–145)

## 2023-02-02 LAB — CBC
HCT: 50.2 % (ref 39.0–52.0)
Hemoglobin: 16.3 g/dL (ref 13.0–17.0)
MCH: 29.8 pg (ref 26.0–34.0)
MCHC: 32.5 g/dL (ref 30.0–36.0)
MCV: 91.8 fL (ref 80.0–100.0)
Platelets: 316 10*3/uL (ref 150–400)
RBC: 5.47 MIL/uL (ref 4.22–5.81)
RDW: 14.7 % (ref 11.5–15.5)
WBC: 14.7 10*3/uL — ABNORMAL HIGH (ref 4.0–10.5)
nRBC: 0 % (ref 0.0–0.2)

## 2023-02-02 LAB — HEPATIC FUNCTION PANEL
ALT: 12 U/L (ref 0–44)
AST: 16 U/L (ref 15–41)
Albumin: 3.7 g/dL (ref 3.5–5.0)
Alkaline Phosphatase: 61 U/L (ref 38–126)
Bilirubin, Direct: <0.1 mg/dL (ref 0.0–0.2)
Total Bilirubin: 0.5 mg/dL (ref <1.2)
Total Protein: 6.9 g/dL (ref 6.5–8.1)

## 2023-02-02 LAB — TROPONIN I (HIGH SENSITIVITY)
Troponin I (High Sensitivity): 3 ng/L (ref <18)
Troponin I (High Sensitivity): 4 ng/L (ref <18)

## 2023-02-02 LAB — LIPASE, BLOOD: Lipase: 58 U/L — ABNORMAL HIGH (ref 11–51)

## 2023-02-02 MED ORDER — ONDANSETRON 4 MG PO TBDP
4.0000 mg | ORAL_TABLET | Freq: Three times a day (TID) | ORAL | 0 refills | Status: AC | PRN
Start: 2023-02-02 — End: ?

## 2023-02-02 MED ORDER — IOHEXOL 350 MG/ML SOLN
75.0000 mL | Freq: Once | INTRAVENOUS | Status: AC | PRN
  Administered 2023-02-02: 75 mL via INTRAVENOUS

## 2023-02-02 MED ORDER — TAMSULOSIN HCL 0.4 MG PO CAPS: 0.4000 mg | ORAL_CAPSULE | Freq: Every day | ORAL | 0 refills | Status: AC

## 2023-02-02 NOTE — ED Provider Notes (Signed)
[facility] Provider Note    Event Date/Time   First MD Initiated Contact with Patient 02/02/23 1514     (approximate)   History   Chest Pain   HPI  Hector Pacheco is a 67 y.o. male history of asthma, GERD, A-fib hypertension not on anticoagulation presents to the ER for evaluation of chest pain shortness of breath symptoms of indigestion.  States he was sitting in his lounge chair around noon when this occurred.  Lasted quite a while did feel clammy.  Symptoms resolved on their own.  He now feels at baseline.  Denied any palpitations.  Denies any abdominal pain.  Did have a few episodes of emesis nonbloody no coffee ground emesis.  Denies any nausea at this time.     Physical Exam   Triage Vital Signs: ED Triage Vitals  Encounter Vitals Group     BP 02/02/23 1350 138/84     Systolic BP Percentile --      Diastolic BP Percentile --      Pulse Rate 02/02/23 1350 87     Resp 02/02/23 1350 17     Temp 02/02/23 1350 98.1 F (36.7 C)     Temp Source 02/02/23 1350 Oral     SpO2 02/02/23 1350 95 %     Weight 02/02/23 1349 160 lb 0.9 oz (72.6 kg)     Height 02/02/23 1349 5\' 11"  (1.803 m)     Head Circumference --      Peak Flow --      Pain Score 02/02/23 1349 10     Pain Loc --      Pain Education --      Exclude from Growth Chart --     Most recent vital signs: Vitals:   02/02/23 1727 02/02/23 1842  BP: 100/66 117/72  Pulse: 91 78  Resp: 20 20  Temp:    SpO2: 97% 98%     Constitutional: Alert  Eyes: Conjunctivae are normal.  Head: Atraumatic. Nose: No congestion/rhinnorhea. Mouth/Throat: Mucous membranes are moist.   Neck: Painless ROM.  Cardiovascular:   Good peripheral circulation. Respiratory: Normal respiratory effort.  No retractions.  Gastrointestinal: Soft and nontender.  Musculoskeletal:  no deformity Neurologic:  MAE spontaneously. No gross focal neurologic deficits are appreciated.  Skin:  Skin is warm, dry and intact. No  rash noted. Psychiatric: Mood and affect are normal. Speech and behavior are normal.    ED Results / Procedures / Treatments   Labs (all labs ordered are listed, but only abnormal results are displayed) Labs Reviewed  BASIC METABOLIC PANEL - Abnormal; Notable for the following components:      Result Value   Glucose, Bld 184 (*)    All other components within normal limits  CBC - Abnormal; Notable for the following components:   WBC 14.7 (*)    All other components within normal limits  LIPASE, BLOOD - Abnormal; Notable for the following components:   Lipase 58 (*)    All other components within normal limits  URINALYSIS, ROUTINE W REFLEX MICROSCOPIC - Abnormal; Notable for the following components:   Color, Urine YELLOW (*)    APPearance CLEAR (*)    Specific Gravity, Urine >1.046 (*)    Hgb urine dipstick SMALL (*)    Ketones, ur 20 (*)    All other components within normal limits  HEPATIC FUNCTION PANEL  TROPONIN I (HIGH SENSITIVITY)  TROPONIN I (HIGH SENSITIVITY)     EKG  ED ECG REPORT I, Willy Eddy, the attending physician, personally viewed and interpreted this ECG.   Date: 02/02/2023  EKG Time: 13:50  Rate: 85  Rhythm: sinus  Axis:   Intervals:irbbb  ST&T Change: no stemi, no depressions    RADIOLOGY Please see ED Course for my review and interpretation.  I personally reviewed all radiographic images ordered to evaluate for the above acute complaints and reviewed radiology reports and findings.  These findings were personally discussed with the patient.  Please see medical record for radiology report.    PROCEDURES:  Critical Care performed: No  Procedures   MEDICATIONS ORDERED IN ED: Medications  iohexol (OMNIPAQUE) 350 MG/ML injection 75 mL (75 mLs Intravenous Contrast Given 02/02/23 1740)     IMPRESSION / MDM / ASSESSMENT AND PLAN / ED COURSE  I reviewed the triage vital signs and the nursing notes.                               Differential diagnosis includes, but is not limited to, ACS, pericarditis, esophagitis, boerhaaves, pe, dissection, pna, bronchitis, costochondritis  Patient presenting to the ER for evaluation of symptoms as described above.  Based on symptoms, risk factors and considered above differential, this presenting complaint could reflect a potentially life-threatening illness therefore the patient will be placed on continuous pulse oximetry and telemetry for monitoring.  Laboratory evaluation will be sent to evaluate for the above complaints.  Chest x-ray on my review and interpretation with findings suggestive of of large hiatal hernia.  No pneumothorax.  Will await formal radiology report.  EKG is nonischemic.  Initial troponin is negative.   Clinical Course as of 02/02/23 2134  Mon Feb 02, 2023  2011 Patient reassessed.  Remains well-appearing in no acute distress.  Still awaiting CT imaging report. [PR]  2132 CT without evidence of infection.  Patient is stable and appropriate for outpatient follow-up. [PR]    Clinical Course User Index [PR] Willy Eddy, MD     FINAL CLINICAL IMPRESSION(S) / ED DIAGNOSES   Final diagnoses:  Atypical chest pain  Hiatal hernia  Left ureteral stone     Rx / DC Orders   ED Discharge Orders          Ordered    tamsulosin (FLOMAX) 0.4 MG CAPS capsule  Daily after supper        02/02/23 2106    ondansetron (ZOFRAN-ODT) 4 MG disintegrating tablet  Every 8 hours PRN        02/02/23 2106             Note:  This document was prepared using Dragon voice recognition software and may include unintentional dictation errors.    Willy Eddy, MD 02/02/23 2134

## 2023-02-02 NOTE — ED Triage Notes (Signed)
Pt here via [facility] with cp that started at noon today. Pt states pain is centered but does not radiate. Pt describes the pain is tightness and consistent. Pt has a cardiac hx. Pt alert and oriented and stable in triage.

## 2023-02-02 NOTE — ED Notes (Signed)
First nurse note: Pt here via [facility] from home with c/o of CP that started at noon with no radiation. HX of GERD, seen for the same last month.   137/89 HR: 80 100%    324 ASA given by EMS

## 2023-02-02 NOTE — ED Notes (Signed)
PT back from CT

## 2023-02-02 NOTE — ED Notes (Signed)
Ed provider at bedside

## 2023-04-16 DIAGNOSIS — E7849 Other hyperlipidemia: Secondary | ICD-10-CM | POA: Diagnosis not present

## 2023-04-16 DIAGNOSIS — Q8501 Neurofibromatosis, type 1: Secondary | ICD-10-CM | POA: Diagnosis not present

## 2023-04-28 DIAGNOSIS — K219 Gastro-esophageal reflux disease without esophagitis: Secondary | ICD-10-CM | POA: Diagnosis not present

## 2023-06-30 ENCOUNTER — Encounter: Payer: Self-pay | Admitting: *Deleted

## 2023-07-13 ENCOUNTER — Ambulatory Visit

## 2023-07-13 ENCOUNTER — Encounter: Payer: Self-pay | Admitting: *Deleted

## 2023-07-13 ENCOUNTER — Ambulatory Visit
Admission: RE | Admit: 2023-07-13 | Discharge: 2023-07-13 | Disposition: A | Discharge status: Home / Self Care | Payer: Medicare HMO | Attending: Gastroenterology | Admitting: Gastroenterology

## 2023-07-13 ENCOUNTER — Encounter
Admission: RE | Disposition: A | Discharge status: Home / Self Care | Payer: Self-pay | Source: Home / Self Care | Attending: Gastroenterology

## 2023-07-13 DIAGNOSIS — Z79899 Other long term (current) drug therapy: Secondary | ICD-10-CM | POA: Insufficient documentation

## 2023-07-13 DIAGNOSIS — K222 Esophageal obstruction: Secondary | ICD-10-CM | POA: Insufficient documentation

## 2023-07-13 DIAGNOSIS — J45909 Unspecified asthma, uncomplicated: Secondary | ICD-10-CM | POA: Diagnosis not present

## 2023-07-13 DIAGNOSIS — K449 Diaphragmatic hernia without obstruction or gangrene: Secondary | ICD-10-CM | POA: Diagnosis not present

## 2023-07-13 DIAGNOSIS — K3189 Other diseases of stomach and duodenum: Secondary | ICD-10-CM | POA: Diagnosis not present

## 2023-07-13 DIAGNOSIS — K2100 Gastro-esophageal reflux disease with esophagitis, without bleeding: Secondary | ICD-10-CM | POA: Insufficient documentation

## 2023-07-13 DIAGNOSIS — K2950 Unspecified chronic gastritis without bleeding: Secondary | ICD-10-CM | POA: Diagnosis not present

## 2023-07-13 DIAGNOSIS — I10 Essential (primary) hypertension: Secondary | ICD-10-CM | POA: Insufficient documentation

## 2023-07-13 DIAGNOSIS — K219 Gastro-esophageal reflux disease without esophagitis: Secondary | ICD-10-CM | POA: Diagnosis not present

## 2023-07-13 DIAGNOSIS — I4891 Unspecified atrial fibrillation: Secondary | ICD-10-CM | POA: Diagnosis not present

## 2023-07-13 DIAGNOSIS — K319 Disease of stomach and duodenum, unspecified: Secondary | ICD-10-CM | POA: Insufficient documentation

## 2023-07-13 HISTORY — PX: ESOPHAGOGASTRODUODENOSCOPY (EGD) WITH PROPOFOL: SHX5813

## 2023-07-13 HISTORY — DX: Other hyperlipidemia: E78.49

## 2023-07-13 HISTORY — DX: Personal history of urinary calculi: Z87.442

## 2023-07-13 SURGERY — ESOPHAGOGASTRODUODENOSCOPY (EGD) WITH PROPOFOL
Anesthesia: General

## 2023-07-13 MED ORDER — GLYCOPYRROLATE 0.2 MG/ML IJ SOLN
INTRAMUSCULAR | Status: DC | PRN
  Administered 2023-07-13: .2 mg via INTRAVENOUS

## 2023-07-13 MED ORDER — PROPOFOL 500 MG/50ML IV EMUL
INTRAVENOUS | Status: DC | PRN
  Administered 2023-07-13: 10 mg via INTRAVENOUS
  Administered 2023-07-13: 20 mg via INTRAVENOUS
  Administered 2023-07-13: 100 mg via INTRAVENOUS

## 2023-07-13 MED ORDER — LIDOCAINE HCL (PF) 2 % IJ SOLN
INTRAMUSCULAR | Status: AC
  Filled 2023-07-13: qty 5

## 2023-07-13 MED ORDER — LIDOCAINE HCL (PF) 2 % IJ SOLN
INTRAMUSCULAR | Status: DC | PRN
  Administered 2023-07-13: 100 mg via INTRADERMAL

## 2023-07-13 MED ORDER — SODIUM CHLORIDE 0.9 % IV SOLN
INTRAVENOUS | Status: DC
Start: 2023-07-13 — End: 2023-07-13
  Administered 2023-07-13: 500 mL via INTRAVENOUS

## 2023-07-13 MED ORDER — GLYCOPYRROLATE 0.2 MG/ML IJ SOLN
INTRAMUSCULAR | Status: AC
  Filled 2023-07-13: qty 1

## 2023-07-13 NOTE — Op Note (Signed)
 [facility]astroenterology Patient Name: Hector Pacheco Procedure Date: 07/13/2023 8:07 AM MRN: [id number] Account #: [id number] Date of Birth: 12/13/1955 Admit Type: Outpatient Age: 68 Room: [facility] ENDO ROOM 3 Gender: Male Note Status: Finalized Instrument Name: Almyra Jain [id number] Procedure:             Upper GI endoscopy Indications:           Gastro-esophageal reflux disease Providers:             Leida Puna MD, MD Referring MD:          Little Riff, MD (Referring MD) Medicines:             Monitored Anesthesia Care Complications:         No immediate complications. Estimated blood loss:                         Minimal. Procedure:             Pre-Anesthesia Assessment:                        - Prior to the procedure, a History and Physical was                         performed, and patient medications and allergies were                         reviewed. The patient is competent. The risks and                         benefits of the procedure and the sedation options and                         risks were discussed with the patient. All questions                         were answered and informed consent was obtained.                         Patient identification and proposed procedure were                         verified by the physician, the nurse, the                         anesthesiologist, the anesthetist and the technician                         in the endoscopy suite. Mental Status Examination:                         alert and oriented. Airway Examination: normal                         oropharyngeal airway and neck mobility. Respiratory                         Examination: clear to auscultation. CV Examination:  normal. Prophylactic Antibiotics: The patient does not                         require prophylactic antibiotics. Prior                         Anticoagulants: The patient has taken no anticoagulant                          or antiplatelet agents. ASA Grade Assessment: II - A                         patient with mild systemic disease. After reviewing                         the risks and benefits, the patient was deemed in                         satisfactory condition to undergo the procedure. The                         anesthesia plan was to use monitored anesthesia care                         (MAC). Immediately prior to administration of                         medications, the patient was re-assessed for adequacy                         to receive sedatives. The heart rate, respiratory                         rate, oxygen saturations, blood pressure, adequacy of                         pulmonary ventilation, and response to care were                         monitored throughout the procedure. The physical                         status of the patient was re-assessed after the                         procedure.                        After obtaining informed consent, the endoscope was                         passed under direct vision. Throughout the procedure,                         the patient's blood pressure, pulse, and oxygen                         saturations were monitored continuously. The Endoscope  was introduced through the mouth, and advanced to the                         second part of duodenum. The upper GI endoscopy was                         accomplished without difficulty. The patient tolerated                         the procedure well. Findings:      A low-grade of narrowing Schatzki ring was found in the lower third of       the esophagus.      An 8 cm hiatal hernia was present.      The exam of the stomach was otherwise normal.      Biopsies were taken with a cold forceps in the stomach for Helicobacter       pylori testing. Estimated blood loss was minimal.      The examined duodenum was normal. Impression:            - Low-grade of  narrowing Schatzki ring.                        - 8 cm hiatal hernia.                        - Normal examined duodenum.                        - Biopsies were taken with a cold forceps for                         Helicobacter pylori testing. Recommendation:        - Discharge patient to home.                        - Resume previous diet.                        - Continue present medications.                        - Await pathology results.                        - Return to referring physician as previously                         scheduled.                        - Patient denied symptoms of dysphagia prior to                         procedure. If dysphagia occurs, would recommend repeat                         EGD for dilation. Procedure Code(s):     --- Professional ---                        [phone], Esophagogastroduodenoscopy, flexible,  transoral; with biopsy, single or multiple Diagnosis Code(s):     --- Professional ---                        K22.2, Esophageal obstruction                        K44.9, Diaphragmatic hernia without obstruction or                         gangrene                        K21.9, Gastro-esophageal reflux disease without                         esophagitis CPT copyright 2022 American Medical Association. All rights reserved. The codes documented in this report are preliminary and upon coder review may  be revised to meet current compliance requirements. Leida Puna MD, MD 07/13/2023 8:36:05 AM Number of Addenda: 0 Note Initiated On: 07/13/2023 8:07 AM Estimated Blood Loss:  Estimated blood loss was minimal.      [facility]

## 2023-07-13 NOTE — Transfer of Care (Signed)
 Immediate Anesthesia Transfer of Care Note  Patient: Hector Pacheco  Procedure(s) Performed: ESOPHAGOGASTRODUODENOSCOPY (EGD) WITH PROPOFOL   Patient Location: Endoscopy Unit  Anesthesia Type:General  Level of Consciousness: drowsy  Airway & Oxygen Therapy: Patient Spontanous Breathing  Post-op Assessment: Report given to RN and Post -op Vital signs reviewed and stable  Post vital signs: Reviewed and stable  Last Vitals:  Vitals Value Taken Time  BP 109/82 0833  Temp 35.8 0833  Pulse 74 0833  Resp 16 0833  SpO2 100% 0833    Last Pain:  Vitals:   07/13/23 0713  TempSrc: Temporal  PainSc: 0-No pain         Complications: No notable events documented.

## 2023-07-13 NOTE — H&P (Signed)
 Outpatient short stay form Pre-procedure 07/13/2023  Shane Darling, MD  Primary Physician: Little Riff, MD  Reason for visit:  GERD  History of present illness:    68 y/o gentleman with history of GERD, BPH, and hypertension here for EGD for GERD. No blood thinners. No family history of GI malignancies. No significant neck surgeries.    Current Facility-Administered Medications:    0.9 %  sodium chloride  infusion, , Intravenous, Continuous, Ariyanna Oien, Leanora Prophet, MD, Last Rate: 20 mL/hr at 07/13/23 8119, Restarted at 07/13/23 0750  Medications Prior to Admission  Medication Sig Dispense Refill Last Dose/Taking   acetaminophen (TYLENOL) 500 MG tablet Take 500 mg by mouth every 6 (six) hours as needed.   Past Week   clonazePAM (KLONOPIN) 0.5 MG tablet Take 0.5 mg by mouth at bedtime as needed for anxiety.   07/12/2023 Bedtime   ferrous sulfate 324 MG TBEC Take 324 mg by mouth daily with breakfast.   07/12/2023   metoprolol succinate (TOPROL-XL) 25 MG 24 hr tablet Take 25 mg by mouth daily.   07/12/2023 Evening   omeprazole (PRILOSEC) 20 MG capsule Take 20 mg by mouth daily.   07/12/2023 Morning   simvastatin (ZOCOR) 20 MG tablet Take 20 mg by mouth daily at 6 PM.   Past Week   tamsulosin  (FLOMAX ) 0.4 MG CAPS capsule Take 1 capsule (0.4 mg total) by mouth daily after supper. 10 capsule 0 07/12/2023 Evening   albuterol (VENTOLIN HFA) 108 (90 Base) MCG/ACT inhaler Inhale into the lungs every 6 (six) hours as needed for wheezing or shortness of breath.      ondansetron  (ZOFRAN -ODT) 4 MG disintegrating tablet Take 1 tablet (4 mg total) by mouth every 8 (eight) hours as needed for nausea or vomiting. 8 tablet 0      Allergies  Allergen Reactions   Amoxicillin Swelling    Throat swelling   Penicillins Nausea And Vomiting     Past Medical History:  Diagnosis Date   Allergic rhinitis    Asthma    Atrial fibrillation (HCC)    Familial hyperlipidemia, high LDL    GERD  (gastroesophageal reflux disease)    High cholesterol    History of kidney stones    Hypertension    Neurofibromatosis (HCC)    Non-cardiac chest pain     Review of systems:  Otherwise negative.    Physical Exam  Gen: Alert, oriented. Appears stated age.  HEENT: PERRLA. Lungs: No respiratory distress CV: RRR Abd: soft, benign, no masses Ext: No edema    Planned procedures: Proceed with EGD. The patient understands the nature of the planned procedure, indications, risks, alternatives and potential complications including but not limited to bleeding, infection, perforation, damage to internal organs and possible oversedation/side effects from anesthesia. The patient agrees and gives consent to proceed.  Please refer to procedure notes for findings, recommendations and patient disposition/instructions.     Shane Darling, MD [facility]enterology

## 2023-07-13 NOTE — Anesthesia Postprocedure Evaluation (Signed)
 Anesthesia Post Note  Patient: Hector Pacheco  Procedure(s) Performed: ESOPHAGOGASTRODUODENOSCOPY (EGD) WITH PROPOFOL   Patient location during evaluation: Endoscopy Anesthesia Type: General Level of consciousness: awake and alert Pain management: pain level controlled Vital Signs Assessment: post-procedure vital signs reviewed and stable Respiratory status: spontaneous breathing, nonlabored ventilation, respiratory function stable and patient connected to nasal cannula oxygen Cardiovascular status: blood pressure returned to baseline and stable Postop Assessment: no apparent nausea or vomiting Anesthetic complications: no   No notable events documented.   Last Vitals:  Vitals:   07/13/23 0843 07/13/23 0853  BP:    Pulse:    Resp:    Temp:    SpO2: 100% 100%    Last Pain:  Vitals:   07/13/23 0853  TempSrc:   PainSc: 0-No pain                 Lattie Poli

## 2023-07-13 NOTE — Anesthesia Preprocedure Evaluation (Signed)
 Anesthesia Evaluation  Patient identified by MRN, date of birth, ID band Patient awake    Reviewed: Allergy & Precautions, NPO status , Patient's Chart, lab work & pertinent test results  History of Anesthesia Complications Negative for: history of anesthetic complications  Airway Mallampati: II  TM Distance: >3 FB Neck ROM: Full    Dental  (+) Poor Dentition, Edentulous Upper   Pulmonary asthma , neg sleep apnea   breath sounds clear to auscultation- rhonchi (-) wheezing      Cardiovascular Exercise Tolerance: Good hypertension, Pt. on medications (-) CAD, (-) Past MI, (-) Cardiac Stents and (-) CABG + dysrhythmias Atrial Fibrillation  Rhythm:Regular Rate:Normal - Systolic murmurs and - Diastolic murmurs 1610 stress echo indeterminate for ischemia due to target HR not being reached, but echo component was relatively unremarkable   Neuro/Psych neg Seizures negative neurological ROS  negative psych ROS   GI/Hepatic Neg liver ROS,GERD  ,,  Endo/Other  negative endocrine ROSneg diabetes    Renal/GU Renal disease: hx of nephrolithiasis.     Musculoskeletal negative musculoskeletal ROS (+)    Abdominal  (+) - obese  Peds  Hematology negative hematology ROS (+)   Anesthesia Other Findings Past Medical History: No date: Allergic rhinitis No date: Asthma No date: Atrial fibrillation (HCC) No date: GERD (gastroesophageal reflux disease) No date: High cholesterol No date: Hypertension No date: Kidney stones No date: Neurofibromatosis (HCC) No date: Non-cardiac chest pain   Reproductive/Obstetrics                             Anesthesia Physical Anesthesia Plan  ASA: 2  Anesthesia Plan: General   Post-op Pain Management: Minimal or no pain anticipated   Induction: Intravenous  PONV Risk Score and Plan: 2 and Propofol  infusion  Airway Management Planned: Natural Airway  Additional  Equipment: None  Intra-op Plan:   Post-operative Plan:   Informed Consent: I have reviewed the patients History and Physical, chart, labs and discussed the procedure including the risks, benefits and alternatives for the proposed anesthesia with the patient or authorized representative who has indicated his/her understanding and acceptance.     Dental advisory given  Plan Discussed with: CRNA and Anesthesiologist  Anesthesia Plan Comments: (Discussed risks of anesthesia with patient, including possibility of difficulty with spontaneous ventilation under anesthesia necessitating airway intervention, PONV, and rare risks such as cardiac or respiratory or neurological events, and allergic reactions. Discussed the role of CRNA in patient's perioperative care. Patient understands.)        Anesthesia Quick Evaluation

## 2023-07-13 NOTE — Interval H&P Note (Signed)
 History and Physical Interval Note:  07/13/2023 8:14 AM  Hector Pacheco  has presented today for surgery, with the diagnosis of GERD.  The various methods of treatment have been discussed with the patient and family. After consideration of risks, benefits and other options for treatment, the patient has consented to  Procedure(s): ESOPHAGOGASTRODUODENOSCOPY (EGD) WITH PROPOFOL  (N/A) as a surgical intervention.  The patient's history has been reviewed, patient examined, no change in status, stable for surgery.  I have reviewed the patient's chart and labs.  Questions were answered to the patient's satisfaction.     Shane Darling  Ok to proceed with EGD

## 2023-07-14 ENCOUNTER — Encounter: Payer: Self-pay | Admitting: Gastroenterology

## 2023-07-14 LAB — SURGICAL PATHOLOGY

## 2023-07-23 DIAGNOSIS — E7849 Other hyperlipidemia: Secondary | ICD-10-CM | POA: Diagnosis not present

## 2023-07-30 DIAGNOSIS — J45901 Unspecified asthma with (acute) exacerbation: Secondary | ICD-10-CM | POA: Diagnosis not present

## 2023-07-30 DIAGNOSIS — K219 Gastro-esophageal reflux disease without esophagitis: Secondary | ICD-10-CM | POA: Diagnosis not present

## 2023-07-30 DIAGNOSIS — Z125 Encounter for screening for malignant neoplasm of prostate: Secondary | ICD-10-CM | POA: Diagnosis not present

## 2023-07-30 DIAGNOSIS — Z0000 Encounter for general adult medical examination without abnormal findings: Secondary | ICD-10-CM | POA: Diagnosis not present

## 2023-07-30 DIAGNOSIS — Z1331 Encounter for screening for depression: Secondary | ICD-10-CM | POA: Diagnosis not present

## 2023-07-30 DIAGNOSIS — Q8501 Neurofibromatosis, type 1: Secondary | ICD-10-CM | POA: Diagnosis not present

## 2023-07-30 DIAGNOSIS — E7849 Other hyperlipidemia: Secondary | ICD-10-CM | POA: Diagnosis not present

## 2023-07-30 DIAGNOSIS — Z0001 Encounter for general adult medical examination with abnormal findings: Secondary | ICD-10-CM | POA: Diagnosis not present

## 2023-10-12 DIAGNOSIS — H2513 Age-related nuclear cataract, bilateral: Secondary | ICD-10-CM | POA: Diagnosis not present
